# Patient Record
Sex: Female | Born: 1985 | Race: Asian | Hispanic: Yes | Marital: Married | Smoking: Never smoker
Health system: Southern US, Community
[De-identification: ages and names within clinical notes are randomized; demographics above are authoritative.]

## PROBLEM LIST (undated history)

## (undated) DIAGNOSIS — O24419 Gestational diabetes mellitus in pregnancy, unspecified control: Secondary | ICD-10-CM

## (undated) HISTORY — PX: NO PAST SURGERIES: SHX2092

---

## 2003-07-19 ENCOUNTER — Inpatient Hospital Stay (HOSPITAL_COMMUNITY): Admission: AD | Admit: 2003-07-19 | Discharge: 2003-07-19 | Payer: Self-pay | Admitting: Obstetrics and Gynecology

## 2003-07-26 ENCOUNTER — Inpatient Hospital Stay (HOSPITAL_COMMUNITY): Admission: AD | Admit: 2003-07-26 | Discharge: 2003-07-29 | Payer: Self-pay | Admitting: Obstetrics & Gynecology

## 2007-03-07 ENCOUNTER — Encounter: Admission: RE | Admit: 2007-03-07 | Discharge: 2007-03-07 | Payer: Self-pay | Admitting: Obstetrics and Gynecology

## 2007-04-23 ENCOUNTER — Inpatient Hospital Stay (HOSPITAL_COMMUNITY): Admission: RE | Admit: 2007-04-23 | Discharge: 2007-04-25 | Payer: Self-pay | Admitting: Obstetrics and Gynecology

## 2010-06-08 ENCOUNTER — Emergency Department (HOSPITAL_COMMUNITY)
Admission: EM | Admit: 2010-06-08 | Discharge: 2010-06-08 | Payer: Self-pay | Source: Home / Self Care | Admitting: Family Medicine

## 2010-11-07 NOTE — Discharge Summary (Signed)
Paula Holland, Paula Holland       ACCOUNT NO.:  [id number]   MEDICAL RECORD NO.:  [id number]          PATIENT TYPE:  INP   LOCATION:  9119                          FACILITY:  [facility]   PHYSICIAN:  Gerrit Friends. Aldona Bar, M.D.   DATE OF BIRTH:  08-02-1985   DATE OF ADMISSION:  04/23/2007  DATE OF DISCHARGE:  04/25/2007                               DISCHARGE SUMMARY   DISCHARGE DIAGNOSES:  1. Term pregnancy delivered 6-pound 11-ounce female infant, Apgars 9 and      9.  2. Blood type O positive.   PROCEDURE:  Normal spontaneous delivery.   SUMMARY:  This 25 year old gravida 2, now para 2 was admitted at 53 to  [redacted] weeks gestation for induction, after a pregnancy was complicated by  gestational diabetes, well-controlled with diet.  She also had a  positive group B strep antenatally.   She was admitted for induction, placed on intravenous penicillin per  protocol, because of her group B strep culture being positive and  subsequently underwent amniotomy with production of clear fluid,  progressed and had a normal spontaneous delivery over intact perineum of  a viable female infant, weighing 6 pounds, 11 ounces with Apgar of 9 and  9.  Her postpartum course was uncomplicated.  Discharge hemoglobin 13.5  with a white count of 14,100.  Her platelet count was 167,000 at time of  discharge.   On the morning of October 31, she was ambulating well, tolerating a  regular diet well, requiring minimal pain medication, was breast-feeding  without difficulty and was discharged to home.  She was given a  discharge brochure.  Followup in the office will be arranged 4 weeks  hence.  She will use over-the-counter analgesics for discomfort as  needed.   CONDITION ON DISCHARGE:  Improved.      Gerrit Friends. Aldona Bar, M.D.  Electronically Signed     RMW/MEDQ  D:  04/25/2007  T:  04/25/2007  Job:  [id number]

## 2010-11-15 ENCOUNTER — Emergency Department (HOSPITAL_COMMUNITY)
Admission: EM | Admit: 2010-11-15 | Discharge: 2010-11-15 | Disposition: A | Discharge status: Home / Self Care | Payer: Self-pay | Attending: Emergency Medicine | Admitting: Emergency Medicine

## 2010-11-15 DIAGNOSIS — L298 Other pruritus: Secondary | ICD-10-CM | POA: Insufficient documentation

## 2010-11-15 DIAGNOSIS — L509 Urticaria, unspecified: Secondary | ICD-10-CM | POA: Insufficient documentation

## 2010-11-15 DIAGNOSIS — L2989 Other pruritus: Secondary | ICD-10-CM | POA: Insufficient documentation

## 2011-04-04 LAB — CBC
HCT: 39.1
Hemoglobin: 14.9
MCHC: 34.6
MCHC: 35.9
MCV: 86.7
MCV: 88.7
Platelets: 135 — ABNORMAL LOW
RBC: 4.78
RDW: 15.9 — ABNORMAL HIGH

## 2015-02-25 LAB — OB RESULTS CONSOLE RUBELLA ANTIBODY, IGM: Rubella: IMMUNE

## 2015-02-25 LAB — OB RESULTS CONSOLE GC/CHLAMYDIA
Chlamydia: NEGATIVE
Gonorrhea: NEGATIVE

## 2015-02-25 LAB — OB RESULTS CONSOLE HEPATITIS B SURFACE ANTIGEN: HEP B S AG: NEGATIVE

## 2015-02-25 LAB — OB RESULTS CONSOLE HIV ANTIBODY (ROUTINE TESTING): HIV: NONREACTIVE

## 2015-02-25 LAB — OB RESULTS CONSOLE ANTIBODY SCREEN: Antibody Screen: NEGATIVE

## 2015-02-25 LAB — OB RESULTS CONSOLE ABO/RH: RH TYPE: POSITIVE

## 2015-02-25 LAB — OB RESULTS CONSOLE RPR: RPR: NONREACTIVE

## 2015-06-23 ENCOUNTER — Ambulatory Visit (HOSPITAL_COMMUNITY)
Admission: RE | Admit: 2015-06-23 | Discharge: 2015-06-23 | Disposition: A | Discharge status: Home / Self Care | Payer: Self-pay | Source: Ambulatory Visit | Attending: Obstetrics | Admitting: Obstetrics

## 2015-06-23 ENCOUNTER — Encounter: Payer: Self-pay | Attending: Obstetrics & Gynecology | Admitting: *Deleted

## 2015-06-23 DIAGNOSIS — Z713 Dietary counseling and surveillance: Secondary | ICD-10-CM | POA: Insufficient documentation

## 2015-06-23 DIAGNOSIS — O24419 Gestational diabetes mellitus in pregnancy, unspecified control: Secondary | ICD-10-CM | POA: Insufficient documentation

## 2015-06-23 NOTE — Progress Notes (Signed)
  Patient was seen on 06/23/15 for Gestational Diabetes self-management . [place] [phone] ([place]) The following learning objectives were met by the patient :   States the definition of Gestational Diabetes  States why dietary management is important in controlling blood glucose  Describes the effects of carbohydrates on blood glucose levels  Demonstrates ability to create a balanced meal plan  Demonstrates carbohydrate counting   States when to check blood glucose levels  Demonstrates proper blood glucose monitoring techniques  States the effect of stress and exercise on blood glucose levels  States the importance of limiting caffeine and abstaining from alcohol and smoking  Plan:  Aim for 2 Carb Choices per meal (30 grams) +/- 1 either way for breakfast Aim for 3 Carb Choices per meal (45 grams) +/- 1 either way from lunch and dinner Aim for 1-2 Carbs per snack Begin reading food labels for Total Carbohydrate and sugar grams of foods Consider  increasing your activity level by walking daily as tolerated Begin checking BG before breakfast and 2 hours after first bit of breakfast, lunch and dinner after  as directed by MD  Take medication  as directed by MD  Blood glucose monitor:directed patient to [place] for [employer] brand testing supplies Blood glucose reading: '149mg'$ /dl 1hpp pasta  Patient instructed to monitor glucose levels: FBS: 60 - <90 2 hour: <120  Patient received the following handouts:  Nutrition Diabetes and Pregnancy  Carbohydrate Counting List  Meal Planning worksheet  Patient will be seen for follow-up as needed.

## 2015-06-26 NOTE — L&D Delivery Note (Signed)
Delivery Note At 2:47 PM a viable female was delivered via Vaginal, Spontaneous Delivery (Presentation: ;  ).  APGAR: , ; weight  .   Placenta status: , .  Cord: 3 vessels with the following complications: None.  Cord pH: not done  Anesthesia: Epidural  Episiotomy:   Lacerations:   Suture Repair: 2.0 vicryl Est. Blood Loss (mL):    Mom to postpartum.  Baby to Couplet care / Skin to Skin.  MARSHALL,BERNARD A 08/31/2015, 2:55 PM

## 2015-06-28 ENCOUNTER — Inpatient Hospital Stay (HOSPITAL_COMMUNITY): Admission: AD | Admit: 2015-06-28 | Payer: Self-pay | Source: Ambulatory Visit | Admitting: Obstetrics

## 2015-07-12 ENCOUNTER — Encounter (HOSPITAL_COMMUNITY): Payer: Self-pay | Admitting: Obstetrics

## 2015-07-12 ENCOUNTER — Other Ambulatory Visit (HOSPITAL_COMMUNITY): Payer: Self-pay | Admitting: Obstetrics

## 2015-07-12 DIAGNOSIS — O24913 Unspecified diabetes mellitus in pregnancy, third trimester: Secondary | ICD-10-CM

## 2015-07-12 DIAGNOSIS — Z3A32 32 weeks gestation of pregnancy: Secondary | ICD-10-CM

## 2015-07-12 DIAGNOSIS — Z3689 Encounter for other specified antenatal screening: Secondary | ICD-10-CM

## 2015-07-19 ENCOUNTER — Ambulatory Visit (HOSPITAL_COMMUNITY)
Admission: RE | Admit: 2015-07-19 | Discharge: 2015-07-19 | Disposition: A | Discharge status: Home / Self Care | Payer: Self-pay | Source: Ambulatory Visit | Attending: Obstetrics | Admitting: Obstetrics

## 2015-07-19 ENCOUNTER — Encounter (HOSPITAL_COMMUNITY): Payer: Self-pay

## 2015-07-19 ENCOUNTER — Other Ambulatory Visit (HOSPITAL_COMMUNITY): Payer: Self-pay | Admitting: Obstetrics

## 2015-07-19 VITALS — BP 119/78 | HR 67 | Wt 187.2 lb

## 2015-07-19 DIAGNOSIS — Z3A32 32 weeks gestation of pregnancy: Secondary | ICD-10-CM | POA: Insufficient documentation

## 2015-07-19 DIAGNOSIS — Z3689 Encounter for other specified antenatal screening: Secondary | ICD-10-CM

## 2015-07-19 DIAGNOSIS — O24419 Gestational diabetes mellitus in pregnancy, unspecified control: Secondary | ICD-10-CM

## 2015-07-19 DIAGNOSIS — O24410 Gestational diabetes mellitus in pregnancy, diet controlled: Secondary | ICD-10-CM | POA: Insufficient documentation

## 2015-07-19 DIAGNOSIS — O24913 Unspecified diabetes mellitus in pregnancy, third trimester: Secondary | ICD-10-CM

## 2015-07-19 DIAGNOSIS — Z36 Encounter for antenatal screening of mother: Secondary | ICD-10-CM | POA: Insufficient documentation

## 2015-07-19 HISTORY — DX: Gestational diabetes mellitus in pregnancy, unspecified control: O24.419

## 2015-07-19 NOTE — Progress Notes (Signed)
MFM Consultation, staff note:  By way of consultation, I met with Ms. Paula Holland and explained the rationale behind testing for and treating gestational diabetes. I outlined the risks of poorly controlled GDM, including abnormal fetal growth, difficult labor and vaginal delivery, increased risk of cesarean section, and the neonatal metabolic complications including hypoglycemia and the potential for delayed pulmonary maturity.  We also discussed the management of gestational diabetes, with an emphasis on diet and activity/exercise as means of controlling blood sugars.  I detailed the usual schedule of glucose monitoring, with fingerstick testing in the fasting state, and two hours after each meal.  I gave her the usual target blood glucose values of <95 mg/dl fasting and <120 mg/dl at two hours postprandial.  Finally, I went over a protocol for fetal testing which includes serial ultrasounds for fetal growth and twice weekly non-stress tests after 32 weeks for patients with medication requirements, macrosomia, or polyhydramnios.   She should get continued formal dietary counseling.  We will continue to keep touch with her on a regular basis to assess her glycemic control. Since diet alone has failed to control blood sugars adequately, I initiated treatment with an oral hypoglycemic:  Glyburide 1.50m one tablet orally qHS (given the predominance or majority of elevated fastings with some elevated postprandials).  Before leaving, she scheduled a follow-up ultrasound in [redacted] week along with glycemic review.  Recommendation Summary: 1. Glycemic targets reinforced.  2. begin and continue antenatal testing.  Fetal kick counts.  Weekly BPP/AFI. 3. serial growth 4. glyburide 1.262mpo qhs initiated (low dose given patient naive to oral hypoglycemic agents).  will reevaluate in 1 week and adjust accordingly, expecting need to double the dosage. 5. Delivery around 39 weeks  Time Spent: I spent in  excess of 30 minutes in consultation with this patient to review records, evaluate her case, and provide her with an adequate discussion and education.  More than 50% of this time was spent in direct face-to-face counseling.  It was a pleasure seeing your patient in the office today.  Thank you for consultation. Please do not hesitate to contact our service for any further questions.  Thank you,  JeDelman CheadleeHarl FavorJeDelman CheadleMD, MS, FACOG Assistant Professor Section of [place]

## 2015-07-19 NOTE — Consult Note (Signed)
MFM Consultation, staff note:  By way of consultation, I met with Ms. Paula Holland and explained the rationale behind testing for and treating gestational diabetes. I outlined the risks of poorly controlled GDM, including abnormal fetal growth, difficult labor and vaginal delivery, increased risk of cesarean section, and the neonatal metabolic complications including hypoglycemia and the potential for delayed pulmonary maturity.  We also discussed the management of gestational diabetes, with an emphasis on diet and activity/exercise as means of controlling blood sugars.  I detailed the usual schedule of glucose monitoring, with fingerstick testing in the fasting state, and two hours after each meal.  I gave her the usual target blood glucose values of <95 mg/dl fasting and <120 mg/dl at two hours postprandial.  Finally, I went over a protocol for fetal testing which includes serial ultrasounds for fetal growth and twice weekly non-stress tests after 32 weeks for patients with medication requirements, macrosomia, or polyhydramnios.   She should get continued formal dietary counseling.  We will continue to keep touch with her on a regular basis to assess her glycemic control. Since diet alone has failed to control blood sugars adequately, I initiated treatment with an oral hypoglycemic:  Glyburide 1.38m one tablet orally qHS (given the predominance or majority of elevated fastings with some elevated postprandials).  Before leaving, she scheduled a follow-up ultrasound in [redacted] week along with glycemic review.  Recommendation Summary: 1. Glycemic targets reinforced.  2. begin and continue antenatal testing.  Fetal kick counts.  Weekly BPP/AFI. 3. serial growth 4. glyburide 1.281mpo qhs initiated (low dose given patient naive to oral hypoglycemic agents).  will reevaluate in 1 week and adjust accordingly, expecting need to double the dosage. 5. Delivery around 39 weeks  Time Spent: I spent in  excess of 30 minutes in consultation with this patient to review records, evaluate her case, and provide her with an adequate discussion and education.  More than 50% of this time was spent in direct face-to-face counseling.  It was a pleasure seeing your patient in the office today.  Thank you for consultation. Please do not hesitate to contact our service for any further questions.  Thank you,  JeDelman CheadleeHarl FavorJeDelman CheadleMD, MS, FACOG Assistant Professor Section of [place]

## 2015-07-20 ENCOUNTER — Telehealth (HOSPITAL_COMMUNITY): Payer: Self-pay | Admitting: *Deleted

## 2015-07-20 NOTE — Telephone Encounter (Signed)
Prescription for Glyburide 1.25mg  po QHS called into [facility] pharmacy on [address] per patient request.  Eda Royal, interpreter, called to verify pharmacy and make patient aware of prescription being called in.

## 2015-07-26 ENCOUNTER — Other Ambulatory Visit (HOSPITAL_COMMUNITY): Payer: Self-pay

## 2015-07-27 ENCOUNTER — Ambulatory Visit (HOSPITAL_COMMUNITY): Payer: Self-pay

## 2015-07-27 ENCOUNTER — Ambulatory Visit (HOSPITAL_COMMUNITY)
Admission: RE | Admit: 2015-07-27 | Discharge: 2015-07-27 | Disposition: A | Discharge status: Home / Self Care | Payer: Self-pay | Source: Ambulatory Visit | Attending: Obstetrics | Admitting: Obstetrics

## 2015-07-27 ENCOUNTER — Other Ambulatory Visit (HOSPITAL_COMMUNITY): Payer: Self-pay | Admitting: Obstetrics and Gynecology

## 2015-07-27 DIAGNOSIS — O24419 Gestational diabetes mellitus in pregnancy, unspecified control: Secondary | ICD-10-CM

## 2015-07-27 DIAGNOSIS — O24415 Gestational diabetes mellitus in pregnancy, controlled by oral hypoglycemic drugs: Secondary | ICD-10-CM

## 2015-07-27 DIAGNOSIS — Z3A34 34 weeks gestation of pregnancy: Secondary | ICD-10-CM | POA: Insufficient documentation

## 2015-07-29 ENCOUNTER — Ambulatory Visit (HOSPITAL_COMMUNITY): Payer: Self-pay

## 2015-08-03 ENCOUNTER — Encounter (HOSPITAL_COMMUNITY): Payer: Self-pay

## 2015-08-03 ENCOUNTER — Ambulatory Visit (HOSPITAL_COMMUNITY)
Admission: RE | Admit: 2015-08-03 | Discharge: 2015-08-03 | Disposition: A | Discharge status: Home / Self Care | Payer: Self-pay | Source: Ambulatory Visit | Attending: Obstetrics and Gynecology | Admitting: Obstetrics and Gynecology

## 2015-08-03 DIAGNOSIS — Z3A35 35 weeks gestation of pregnancy: Secondary | ICD-10-CM | POA: Insufficient documentation

## 2015-08-03 DIAGNOSIS — O24419 Gestational diabetes mellitus in pregnancy, unspecified control: Secondary | ICD-10-CM

## 2015-08-03 DIAGNOSIS — O24415 Gestational diabetes mellitus in pregnancy, controlled by oral hypoglycemic drugs: Secondary | ICD-10-CM | POA: Insufficient documentation

## 2015-08-05 ENCOUNTER — Other Ambulatory Visit (HOSPITAL_COMMUNITY): Payer: Self-pay

## 2015-08-09 LAB — OB RESULTS CONSOLE GBS: STREP GROUP B AG: NEGATIVE

## 2015-08-10 ENCOUNTER — Other Ambulatory Visit: Payer: Self-pay | Admitting: Obstetrics

## 2015-08-10 ENCOUNTER — Ambulatory Visit (HOSPITAL_COMMUNITY)
Admission: RE | Admit: 2015-08-10 | Discharge: 2015-08-10 | Disposition: A | Discharge status: Home / Self Care | Payer: Self-pay | Source: Ambulatory Visit | Attending: Obstetrics and Gynecology | Admitting: Obstetrics and Gynecology

## 2015-08-10 ENCOUNTER — Other Ambulatory Visit (HOSPITAL_COMMUNITY): Payer: Self-pay | Admitting: Obstetrics and Gynecology

## 2015-08-10 ENCOUNTER — Other Ambulatory Visit (HOSPITAL_COMMUNITY): Payer: Self-pay

## 2015-08-10 ENCOUNTER — Encounter (HOSPITAL_COMMUNITY): Payer: Self-pay

## 2015-08-10 DIAGNOSIS — Z3A36 36 weeks gestation of pregnancy: Secondary | ICD-10-CM

## 2015-08-10 DIAGNOSIS — O24419 Gestational diabetes mellitus in pregnancy, unspecified control: Secondary | ICD-10-CM

## 2015-08-10 DIAGNOSIS — O24415 Gestational diabetes mellitus in pregnancy, controlled by oral hypoglycemic drugs: Secondary | ICD-10-CM

## 2015-08-10 MED ORDER — TETANUS-DIPHTH-ACELL PERTUSSIS 5-2.5-18.5 LF-MCG/0.5 IM SUSP
0.5000 mL | Freq: Once | INTRAMUSCULAR | Status: AC
  Administered 2015-08-10: 0.5 mL via INTRAMUSCULAR
  Filled 2015-08-10: qty 0.5

## 2015-08-18 ENCOUNTER — Encounter (HOSPITAL_COMMUNITY): Payer: Self-pay

## 2015-08-18 ENCOUNTER — Other Ambulatory Visit (HOSPITAL_COMMUNITY): Payer: Self-pay | Admitting: Obstetrics and Gynecology

## 2015-08-18 ENCOUNTER — Ambulatory Visit (HOSPITAL_COMMUNITY)
Admission: RE | Admit: 2015-08-18 | Discharge: 2015-08-18 | Disposition: A | Discharge status: Home / Self Care | Payer: Self-pay | Source: Ambulatory Visit | Attending: Obstetrics | Admitting: Obstetrics

## 2015-08-18 DIAGNOSIS — O24419 Gestational diabetes mellitus in pregnancy, unspecified control: Secondary | ICD-10-CM

## 2015-08-18 DIAGNOSIS — Z3A37 37 weeks gestation of pregnancy: Secondary | ICD-10-CM | POA: Insufficient documentation

## 2015-08-18 DIAGNOSIS — O24415 Gestational diabetes mellitus in pregnancy, controlled by oral hypoglycemic drugs: Secondary | ICD-10-CM

## 2015-08-18 NOTE — ED Notes (Signed)
Blood sugar log copied, scanned to chart and rev'd by Dr. Otho Perl.  Refill for Glyburide 1.25mg  called to [facility] on [address], per Dr. Otho Perl.

## 2015-08-23 ENCOUNTER — Other Ambulatory Visit: Payer: Self-pay | Admitting: Obstetrics

## 2015-08-23 ENCOUNTER — Telehealth (HOSPITAL_COMMUNITY): Payer: Self-pay | Admitting: *Deleted

## 2015-08-23 NOTE — Telephone Encounter (Signed)
Preadmission screen  

## 2015-08-24 ENCOUNTER — Other Ambulatory Visit (HOSPITAL_COMMUNITY): Payer: Self-pay

## 2015-08-24 ENCOUNTER — Encounter (HOSPITAL_COMMUNITY): Payer: Self-pay | Admitting: *Deleted

## 2015-08-24 ENCOUNTER — Ambulatory Visit (HOSPITAL_COMMUNITY): Payer: Self-pay

## 2015-08-25 ENCOUNTER — Encounter (HOSPITAL_COMMUNITY): Payer: Self-pay | Admitting: *Deleted

## 2015-08-25 ENCOUNTER — Other Ambulatory Visit (HOSPITAL_COMMUNITY): Payer: Self-pay | Admitting: Maternal and Fetal Medicine

## 2015-08-25 ENCOUNTER — Ambulatory Visit (HOSPITAL_COMMUNITY)
Admission: RE | Admit: 2015-08-25 | Discharge: 2015-08-25 | Disposition: A | Discharge status: Home / Self Care | Payer: Self-pay | Source: Ambulatory Visit | Attending: Obstetrics | Admitting: Obstetrics

## 2015-08-25 DIAGNOSIS — Z3A38 38 weeks gestation of pregnancy: Secondary | ICD-10-CM

## 2015-08-25 DIAGNOSIS — O24415 Gestational diabetes mellitus in pregnancy, controlled by oral hypoglycemic drugs: Secondary | ICD-10-CM | POA: Insufficient documentation

## 2015-08-25 NOTE — Telephone Encounter (Signed)
Interpreter number [phone]

## 2015-08-31 ENCOUNTER — Inpatient Hospital Stay (HOSPITAL_COMMUNITY): Payer: Medicaid Other | Admitting: Anesthesiology

## 2015-08-31 ENCOUNTER — Inpatient Hospital Stay (HOSPITAL_COMMUNITY)
Admission: RE | Admit: 2015-08-31 | Discharge: 2015-09-02 | DRG: 775 | Disposition: A | Discharge status: Home / Self Care | Payer: Medicaid Other | Source: Ambulatory Visit | Attending: Obstetrics | Admitting: Obstetrics

## 2015-08-31 ENCOUNTER — Encounter (HOSPITAL_COMMUNITY): Payer: Self-pay

## 2015-08-31 DIAGNOSIS — Z3490 Encounter for supervision of normal pregnancy, unspecified, unspecified trimester: Secondary | ICD-10-CM

## 2015-08-31 DIAGNOSIS — O24425 Gestational diabetes mellitus in childbirth, controlled by oral hypoglycemic drugs: Principal | ICD-10-CM | POA: Diagnosis present

## 2015-08-31 DIAGNOSIS — Z3A39 39 weeks gestation of pregnancy: Secondary | ICD-10-CM

## 2015-08-31 LAB — CBC
HCT: 39.2 % (ref 36.0–46.0)
Hemoglobin: 13.4 g/dL (ref 12.0–15.0)
MCH: 27.7 pg (ref 26.0–34.0)
MCHC: 34.2 g/dL (ref 30.0–36.0)
MCV: 81 fL (ref 78.0–100.0)
PLATELETS: 152 10*3/uL (ref 150–400)
RBC: 4.84 MIL/uL (ref 3.87–5.11)
RDW: 14.9 % (ref 11.5–15.5)
WBC: 8 10*3/uL (ref 4.0–10.5)

## 2015-08-31 LAB — RPR: RPR: NONREACTIVE

## 2015-08-31 LAB — TYPE AND SCREEN
ABO/RH(D): O POS
ANTIBODY SCREEN: NEGATIVE

## 2015-08-31 LAB — GLUCOSE, CAPILLARY: GLUCOSE-CAPILLARY: 98 mg/dL (ref 65–99)

## 2015-08-31 LAB — ABO/RH: ABO/RH(D): O POS

## 2015-08-31 MED ORDER — FENTANYL 2.5 MCG/ML BUPIVACAINE 1/10 % EPIDURAL INFUSION (WH - ANES)
14.0000 mL/h | INTRAMUSCULAR | Status: DC | PRN
  Administered 2015-08-31: 14 mL/h via EPIDURAL
  Filled 2015-08-31: qty 125

## 2015-08-31 MED ORDER — TERBUTALINE SULFATE 1 MG/ML IJ SOLN
0.2500 mg | Freq: Once | INTRAMUSCULAR | Status: DC | PRN
  Filled 2015-08-31: qty 1

## 2015-08-31 MED ORDER — BUTORPHANOL TARTRATE 1 MG/ML IJ SOLN: 1.0000 mg | INTRAMUSCULAR | Status: DC | PRN

## 2015-08-31 MED ORDER — TETANUS-DIPHTH-ACELL PERTUSSIS 5-2.5-18.5 LF-MCG/0.5 IM SUSP: 0.5000 mL | Freq: Once | INTRAMUSCULAR | Status: DC

## 2015-08-31 MED ORDER — EPHEDRINE 5 MG/ML INJ
10.0000 mg | INTRAVENOUS | Status: DC | PRN
  Filled 2015-08-31: qty 2

## 2015-08-31 MED ORDER — OXYTOCIN 10 UNIT/ML IJ SOLN: 2.5000 [IU]/h | INTRAVENOUS | Status: DC

## 2015-08-31 MED ORDER — PHENYLEPHRINE 40 MCG/ML (10ML) SYRINGE FOR IV PUSH (FOR BLOOD PRESSURE SUPPORT)
80.0000 ug | PREFILLED_SYRINGE | INTRAVENOUS | Status: DC | PRN
  Filled 2015-08-31: qty 20
  Filled 2015-08-31: qty 2

## 2015-08-31 MED ORDER — ACETAMINOPHEN 325 MG PO TABS: 650.0000 mg | ORAL_TABLET | ORAL | Status: DC | PRN

## 2015-08-31 MED ORDER — OXYTOCIN 10 UNIT/ML IJ SOLN
1.0000 m[IU]/min | INTRAVENOUS | Status: DC
  Administered 2015-08-31: 2 m[IU]/min via INTRAVENOUS
  Filled 2015-08-31: qty 10

## 2015-08-31 MED ORDER — IBUPROFEN 600 MG PO TABS
600.0000 mg | ORAL_TABLET | Freq: Four times a day (QID) | ORAL | Status: DC
  Administered 2015-08-31 – 2015-09-02 (×8): 600 mg via ORAL
  Filled 2015-08-31 (×8): qty 1

## 2015-08-31 MED ORDER — SIMETHICONE 80 MG PO CHEW: 80.0000 mg | CHEWABLE_TABLET | ORAL | Status: DC | PRN

## 2015-08-31 MED ORDER — OXYCODONE-ACETAMINOPHEN 5-325 MG PO TABS
1.0000 | ORAL_TABLET | ORAL | Status: DC | PRN
  Administered 2015-09-01 (×2): 1 via ORAL
  Filled 2015-08-31 (×2): qty 1

## 2015-08-31 MED ORDER — LACTATED RINGERS IV SOLN
500.0000 mL | INTRAVENOUS | Status: DC | PRN
  Administered 2015-08-31: 500 mL via INTRAVENOUS

## 2015-08-31 MED ORDER — ONDANSETRON HCL 4 MG/2ML IJ SOLN: 4.0000 mg | Freq: Four times a day (QID) | INTRAMUSCULAR | Status: DC | PRN

## 2015-08-31 MED ORDER — OXYTOCIN BOLUS FROM INFUSION: 500.0000 mL | INTRAVENOUS | Status: DC

## 2015-08-31 MED ORDER — CITRIC ACID-SODIUM CITRATE 334-500 MG/5ML PO SOLN: 30.0000 mL | ORAL | Status: DC | PRN

## 2015-08-31 MED ORDER — ONDANSETRON HCL 4 MG PO TABS: 4.0000 mg | ORAL_TABLET | ORAL | Status: DC | PRN

## 2015-08-31 MED ORDER — BENZOCAINE-MENTHOL 20-0.5 % EX AERO: 1.0000 "application " | INHALATION_SPRAY | CUTANEOUS | Status: DC | PRN

## 2015-08-31 MED ORDER — WITCH HAZEL-GLYCERIN EX PADS: 1.0000 "application " | MEDICATED_PAD | CUTANEOUS | Status: DC | PRN

## 2015-08-31 MED ORDER — LANOLIN HYDROUS EX OINT: TOPICAL_OINTMENT | CUTANEOUS | Status: DC | PRN

## 2015-08-31 MED ORDER — DIPHENHYDRAMINE HCL 25 MG PO CAPS: 25.0000 mg | ORAL_CAPSULE | Freq: Four times a day (QID) | ORAL | Status: DC | PRN

## 2015-08-31 MED ORDER — LACTATED RINGERS IV SOLN
INTRAVENOUS | Status: DC
  Administered 2015-08-31: 09:00:00 via INTRAVENOUS

## 2015-08-31 MED ORDER — LIDOCAINE HCL (PF) 1 % IJ SOLN
30.0000 mL | INTRAMUSCULAR | Status: DC | PRN
  Filled 2015-08-31: qty 30

## 2015-08-31 MED ORDER — DIPHENHYDRAMINE HCL 50 MG/ML IJ SOLN: 12.5000 mg | INTRAMUSCULAR | Status: DC | PRN

## 2015-08-31 MED ORDER — DIBUCAINE 1 % RE OINT: 1.0000 "application " | TOPICAL_OINTMENT | RECTAL | Status: DC | PRN

## 2015-08-31 MED ORDER — PRENATAL MULTIVITAMIN CH
1.0000 | ORAL_TABLET | Freq: Every day | ORAL | Status: DC
  Administered 2015-09-01 – 2015-09-02 (×2): 1 via ORAL
  Filled 2015-08-31 (×2): qty 1

## 2015-08-31 MED ORDER — ONDANSETRON HCL 4 MG/2ML IJ SOLN: 4.0000 mg | INTRAMUSCULAR | Status: DC | PRN

## 2015-08-31 MED ORDER — FERROUS SULFATE 325 (65 FE) MG PO TABS
325.0000 mg | ORAL_TABLET | Freq: Two times a day (BID) | ORAL | Status: DC
  Administered 2015-08-31 – 2015-09-02 (×4): 325 mg via ORAL
  Filled 2015-08-31 (×4): qty 1

## 2015-08-31 MED ORDER — OXYCODONE-ACETAMINOPHEN 5-325 MG PO TABS: 2.0000 | ORAL_TABLET | ORAL | Status: DC | PRN

## 2015-08-31 MED ORDER — LACTATED RINGERS IV SOLN: 500.0000 mL | Freq: Once | INTRAVENOUS | Status: DC

## 2015-08-31 MED ORDER — PHENYLEPHRINE 40 MCG/ML (10ML) SYRINGE FOR IV PUSH (FOR BLOOD PRESSURE SUPPORT)
80.0000 ug | PREFILLED_SYRINGE | INTRAVENOUS | Status: DC | PRN
  Filled 2015-08-31: qty 2

## 2015-08-31 MED ORDER — SENNOSIDES-DOCUSATE SODIUM 8.6-50 MG PO TABS
2.0000 | ORAL_TABLET | ORAL | Status: DC
  Administered 2015-08-31: 2 via ORAL
  Filled 2015-08-31 (×2): qty 2

## 2015-08-31 MED ORDER — FLEET ENEMA 7-19 GM/118ML RE ENEM: 1.0000 | ENEMA | RECTAL | Status: DC | PRN

## 2015-08-31 MED ORDER — LIDOCAINE HCL (PF) 1 % IJ SOLN
INTRAMUSCULAR | Status: DC | PRN
  Administered 2015-08-31 (×2): 4 mL via EPIDURAL

## 2015-08-31 MED ORDER — ZOLPIDEM TARTRATE 5 MG PO TABS
5.0000 mg | ORAL_TABLET | Freq: Every evening | ORAL | Status: DC | PRN
Start: 2015-08-31 — End: 2015-09-02

## 2015-08-31 NOTE — Consults (Signed)
  Anesthesia Pain Consult Note  Patient: Brynda GreathouseMaria I Martinez-Barron, 30 y.o., female  Consult Requested by: Kathreen CosierBernard A Marshall, MD  Reason for Consult: Anesthesia epidural rounds  Pt recently received epidural and states she no longer feels contractions. States pain level a 1 with her acceptable pain level a 6/10.         Edison PaceWILKERSON,Cassity Christian 08/31/2015

## 2015-08-31 NOTE — Anesthesia Procedure Notes (Signed)
Epidural Patient location during procedure: OB Start time: 08/31/2015 1:59 PM  Staffing Anesthesiologist: Mal AmabileFOSTER, Berline Semrad Performed by: anesthesiologist   Preanesthetic Checklist Completed: patient identified, site marked, surgical consent, pre-op evaluation, timeout performed, IV checked, risks and benefits discussed and monitors and equipment checked  Epidural Patient position: sitting Prep: site prepped and draped and DuraPrep Patient monitoring: continuous pulse ox and blood pressure Approach: midline Location: L3-L4 Injection technique: LOR air  Needle:  Needle type: Tuohy  Needle gauge: 17 G Needle length: 9 cm and 9 Needle insertion depth: 5 cm cm Catheter type: closed end flexible Catheter size: 19 Gauge Catheter at skin depth: 10 cm Test dose: negative and Other  Assessment Events: blood not aspirated, injection not painful, no injection resistance, negative IV test and no paresthesia  Additional Notes Patient identified. Risks and benefits discussed including failed block, incomplete  Pain control, post dural puncture headache, nerve damage, paralysis, blood pressure Changes, nausea, vomiting, reactions to medications-both toxic and allergic and post Partum back pain. All questions were answered. Patient expressed understanding and wished to proceed. Sterile technique was used throughout procedure. Epidural site was Dressed with sterile barrier dressing. No paresthesias, signs of intravascular injection Or signs of intrathecal spread were encountered.  Patient was more comfortable after the epidural was dosed. Please see RN's note for documentation of vital signs and FHR which are stable.

## 2015-08-31 NOTE — Anesthesia Preprocedure Evaluation (Signed)
Anesthesia Evaluation  Patient identified by MRN, date of birth, ID band Patient awake    Reviewed: Allergy & Precautions, NPO status , Patient's Chart, lab work & pertinent test results  Airway Mallampati: III  TM Distance: >3 FB Neck ROM: Full    Dental no notable dental hx. (+) Teeth Intact   Pulmonary neg pulmonary ROS,    Pulmonary exam normal breath sounds clear to auscultation       Cardiovascular negative cardio ROS Normal cardiovascular exam Rhythm:Regular Rate:Normal     Neuro/Psych negative neurological ROS  negative psych ROS   GI/Hepatic negative GI ROS, Neg liver ROS,   Endo/Other  diabetes, Well Controlled, Gestational, Oral Hypoglycemic AgentsObesity  Renal/GU negative Renal ROS     Musculoskeletal negative musculoskeletal ROS (+)   Abdominal (+) + obese,   Peds  Hematology negative hematology ROS (+)   Anesthesia Other Findings   Reproductive/Obstetrics (+) Pregnancy                             Anesthesia Physical Anesthesia Plan  ASA: III  Anesthesia Plan: Epidural   Post-op Pain Management:    Induction:   Airway Management Planned: Natural Airway  Additional Equipment:   Intra-op Plan:   Post-operative Plan:   Informed Consent: I have reviewed the patients History and Physical, chart, labs and discussed the procedure including the risks, benefits and alternatives for the proposed anesthesia with the patient or authorized representative who has indicated his/her understanding and acceptance.     Plan Discussed with: Anesthesiologist, CRNA and Surgeon  Anesthesia Plan Comments:         Anesthesia Quick Evaluation

## 2015-08-31 NOTE — H&P (Signed)
This is Dr. Francoise CeoBernard Eyan Hagood dictating the history and physical on  Paula Holland  she is a 30 year old gravida 3 para 2002 EDC 09/06/1737 weeks negative GBS patient's a diabetic on glyburide 1.25 mg at bedtime her blood sugar to days 98 and she was brought in for induction cervix 3-4 cm 80% vertex -2 station amniotomy performed the fluids clear and she is on low-dose Pitocin contracting irregularly Past surgical history negative Past medical history history gestational diabetes Social history negative System review noncontributory Physical exam well-developed female in early labor HEENT negative Heart regular rhythm no murmurs no gallops Breasts negative Lungs clear to P&A Abdomen term Pelvic as described above Extremities negative

## 2015-09-01 ENCOUNTER — Ambulatory Visit (HOSPITAL_COMMUNITY): Payer: Self-pay

## 2015-09-01 LAB — CBC
HCT: 37.8 % (ref 36.0–46.0)
HEMOGLOBIN: 12.7 g/dL (ref 12.0–15.0)
MCH: 27.5 pg (ref 26.0–34.0)
MCHC: 33.6 g/dL (ref 30.0–36.0)
MCV: 82 fL (ref 78.0–100.0)
PLATELETS: 121 10*3/uL — AB (ref 150–400)
RBC: 4.61 MIL/uL (ref 3.87–5.11)
RDW: 14.7 % (ref 11.5–15.5)
WBC: 12.5 10*3/uL — ABNORMAL HIGH (ref 4.0–10.5)

## 2015-09-01 NOTE — Progress Notes (Signed)
Patient ID: Paula GreathouseMaria I Holland, female   DOB: 10-08-1985, 30 y.o.   MRN: [id number] Postpartum day one Blood pressure 107/55 afebrile pulse 57 respiration 18 Fundus firm Lochia moderate Legs negative doing well

## 2015-09-01 NOTE — Lactation Note (Signed)
This note was copied from a baby's chart. Lactation Consultation Note Experienced BF mom, but her 2 other children are 7310 and 918 yrs old. Mom is currently BF baby in cradle position leaning over to baby. Interpreter present, encouraged mom to bring baby to her, not lean to baby, and be comfortable during BF. Mom has wide space between cone shaped breast w/everted nipple. Demonstrated hand expression w/noted colostrum. Mom had baby wrapped in blanket, hat on, rm. Warm, mom sweating, and baby's face red. Discussed over heating baby, and will make baby sleepy if to hot. Encouraged STS and remove excess cover when BF. Mom encouraged to feed baby 8-12 times/24 hours and with feeding cues. Referred to Baby and Me Book in Breastfeeding section Pg. 22-23 for position options and Proper latch demonstration. Educated about newborn behavior, I&O, cluster feeding, supply and demand. [facility]/LC brochure given w/resources, support groups and LC services.   Feeding Feeding Type: Breast Fed Length of feed: 20 min  LATCH Score/Interventions Latch: Grasps breast easily, tongue down, lips flanged, rhythmical sucking. Intervention(s): Adjust position;Assist with latch;Breast massage;Breast compression  Audible Swallowing: A few with stimulation Intervention(s): Hand expression;Alternate breast massage  Type of Nipple: Everted at rest and after stimulation  Comfort (Breast/Nipple): Soft / non-tender     Hold (Positioning): No assistance needed to correctly position infant at breast.  LATCH Score: 9  Lactation Tools Discussed/Used WIC Program: No   Consult Status Consult Status: PRN Date: 09/01/15 Follow-up type: In-patient    Dunia Pringle, Diamond NickelLAURA G 09/01/2015, 1:23 AM

## 2015-09-01 NOTE — Progress Notes (Signed)
[employer] used for assessment this morning for the mother and infant.  [employer] # [phone] Cox, Paula StageMarissa Holland

## 2015-09-01 NOTE — Lactation Note (Addendum)
This note was copied from a baby's chart. Lactation Consultation Note  Patient Name: Girl Lynann BeaverMaria Martinez-Barron ZOXWR'UToday's Date: 09/01/2015 Reason for consult: Follow-up assessment  Baby 26 hours old. Assisted by "Bonnye FavaViria" in-house Spanish Interpreter. Mom nursing baby when this LC first entered room. Mom had baby in cradle position on right breast. Enc mom to support baby's head and keep baby tucked in close to maintain a deep latch. Discussed initial nipple discomfort d/t baby stretching nipple during latching. Discussed cluster feeding, and enc feeding with cues. Discussed normal progression of milk coming to volume. Mom states that she has no additional questions at this time.  Maternal Data    Feeding Feeding Type: Breast Fed Length of feed: 35 min  LATCH Score/Interventions Latch: Grasps breast easily, tongue down, lips flanged, rhythmical sucking. Intervention(s):  (Enc supporting baby's head at breast to maintain a deep latch.)  Audible Swallowing: A few with stimulation Intervention(s): Skin to skin  Type of Nipple: Everted at rest and after stimulation  Comfort (Breast/Nipple): Soft / non-tender     Hold (Positioning): No assistance needed to correctly position infant at breast.  LATCH Score: 9  Lactation Tools Discussed/Used     Consult Status Consult Status: Follow-up Date: 09/02/15 Follow-up type: In-patient    Geralynn OchsWILLIARD, Chapel Silverthorn 09/01/2015, 5:40 PM

## 2015-09-01 NOTE — Anesthesia Postprocedure Evaluation (Signed)
Anesthesia Post Note  Patient: Paula GreathouseMaria I Holland  Procedure(s) Performed: * No procedures listed *  Patient location during evaluation: Mother Baby Anesthesia Type: Epidural Level of consciousness: awake, awake and alert and oriented Pain management: pain level controlled Vital Signs Assessment: post-procedure vital signs reviewed and stable Respiratory status: spontaneous breathing Cardiovascular status: stable Postop Assessment: adequate PO intake and no signs of nausea or vomiting Anesthetic complications: no    Last Vitals:  Filed Vitals:   08/31/15 2130 09/01/15 0530  BP: 104/55 107/55  Pulse: 68 57  Temp: 36.9 C 36.5 C  Resp: 18 18    Last Pain:  Filed Vitals:   09/01/15 0628  PainSc: 0-No pain                 Zebastian Carico Hristova

## 2015-09-02 ENCOUNTER — Other Ambulatory Visit (HOSPITAL_COMMUNITY): Payer: Self-pay

## 2015-09-02 NOTE — Progress Notes (Signed)
Patient ID: Paula GreathouseMaria I Holland, female   DOB: 08/04/1985, 30 y.o.   MRN: [id number] Postpartum day 2 blood pressure 114/70 pulse 74 respiration 16 Fundus firm Lochia moderate Legs negative doing well home today

## 2015-09-02 NOTE — Lactation Note (Signed)
This note was copied from a baby's chart. Lactation Consultation Note  Patient Name: Paula Holland GEXBM'WToday's Date: 09/02/2015 Reason for consult: Follow-up assessment   [facility]  Follow up with experienced BF mom of 41 hour old infant. Infant with 11 BF for 10-40 minutes, 3 attempts, 1 void and 2 stools in last 24 hours. Infant weight 7 lb 0.3 oz with weight loss of 6%. LATCH scores 8-9 by bedside RN's.  Infant asleep in mom's arms, mom reports infant just finished feeding. Mom reports nipple tenderness with latch that improves with feeding, enc her to express colostrum post feed ans allow to air dry on nipples.  Reviewed all BF information in Taking Care of Baby and Me Booklet. Engorgement prevention/treatment reviewed with mom. Manual pump given with instructions for use and cleaning. Reviewed I/O and enc parents to maintain feeding log [facility]  [facility] Infant with Ped visit scheduled for today.  Reviewed LC brochure, mom aware of OP Services, BF Support Groups and LC phone #.  Mom with questions in regards to taking Zyrtec for allergies, Shared information in [place] Medications and Mother's Milk 2014, and enc her to discuss with Pediatrician.   Enc mom to call with questions/concerns prn.   Maternal Data Formula Feeding for Exclusion: No Does the patient have breastfeeding experience prior to this delivery?: Yes  Feeding Feeding Type: Breast Fed Length of feed: 20 min  LATCH Score/Interventions                      Lactation Tools Discussed/Used WIC Program: Yes Pump Review: Setup, frequency, and cleaning;Milk Storage   Consult Status Consult Status: Complete Follow-up type: Call as needed    Ed BlalockSharon S Dalene Robards 09/02/2015, 8:36 AM

## 2015-09-02 NOTE — Discharge Summary (Signed)
Obstetric Discharge Summary Reason for Admission: induction of labor Prenatal Procedures: none Intrapartum Procedures: spontaneous vaginal delivery Postpartum Procedures: none Complications-Operative and Postpartum: none HEMOGLOBIN  Date Value Ref Range Status  09/01/2015 12.7 12.0 - 15.0 g/dL Final   HCT  Date Value Ref Range Status  09/01/2015 37.8 36.0 - 46.0 % Final    Physical Exam:  General: alert Lochia: appropriate Uterine Fundus: firm Incision: healing well DVT Evaluation: No evidence of DVT seen on physical exam.  Discharge Diagnoses: Term Pregnancy-delivered  Discharge Information: Date: 09/02/2015 Activity: pelvic rest Diet: routine Medications: Percocet Condition: improved Instructions: refer to practice specific booklet Discharge to: home Follow-up Information    Follow up with MARSHALL,BERNARD A, MD In 6 weeks.   Specialty:  Obstetrics and Gynecology   Contact information:   [address] [place] [id number] [phone]       Newborn Data: Live born female  Birth Weight: 7 lb 7.4 oz (3385 g) APGAR: 9, 9  Home with mother.  MARSHALL,BERNARD A 09/02/2015, 7:03 AM

## 2015-09-02 NOTE — Discharge Instructions (Signed)
Discharge instructions   You can wash your hair  Shower  Eat what you want  Drink what you want  See me in 6 weeks  Your ankles are going to swell more in the next 2 weeks than when pregnant  No sex for 6 weeks   Paula Holland A, MD 09/02/2015

## 2015-09-07 ENCOUNTER — Ambulatory Visit (HOSPITAL_COMMUNITY): Payer: Self-pay

## 2015-09-21 ENCOUNTER — Ambulatory Visit (HOSPITAL_COMMUNITY)
Admission: RE | Admit: 2015-09-21 | Discharge: 2015-09-21 | Disposition: A | Discharge status: Home / Self Care | Payer: Medicaid Other | Source: Ambulatory Visit | Attending: Obstetrics | Admitting: Obstetrics

## 2015-09-21 NOTE — Lactation Note (Addendum)
Lactation Consult for Paula GarinJuliet Holland (DOB: [birth year])  Mother's reason for visit: She was told at the clinic that she wasn't getting a deep enough latch Consult:  Initial Lactation Consultant:  Remigio Eisenmengerichey, Kaedin Hicklin Hamilton  ________________________________________________________________________ [birth year], BW: 3385g (7# 7.4oz) 09-02-15: 7# 0.3oz (nadir, 5.9%) 09-05-15: 7# 0.5oz 09-07-15: 7# 1.5oz 09-15-15: 7# 6 oz 09-16-15: 7# 9.5oz Today's weight (09-21-15): 8# 0.3oz ________________________________________________________________________  Mother's Name: Paula GreathouseMaria I Holland Type of delivery:  Vag Breastfeeding Experience: P3, she nursed her other children for 8 months ________________________________________________________________________  Breastfeeding History (Post Discharge)  Frequency of breastfeeding: q3h Duration of feeding: 5525min/breast  Pumping  Type of pump:  Manual (about 15 min/side)  Frequency: 3-4 times/week Volume: 4-5 oz  Supplementing For the last 2 weeks, Mom has been giving 2 oz formula (Similac) bid using a [place] bottle.   Infant Intake and Output Assessment  Voids: 7 in 24 hrs.  Color:  Clear yellow Stools: 4-5 in 24 hrs.  Color:  Yellow  ________________________________________________________________________  Maternal Breast Assessment  Breast:  Full Nipple:  Erect  _______________________________________________________________________ Feeding Assessment/Evaluation  Initial feeding assessment:  Infant's oral assessment:  WNL  Attached assessment:  Deep  Lips flanged:  Yes.     Suck assessment:  Nutritive and Displays both  Pre-feed weight: 3638 g   Post-feed weight: 3662  g  Amount transferred: 24 ml L breast for 10 min  Pre-feed weight: 3662 g   Post-feed weight: 3702 g  Amount supplemented: 40 ml R breast, for almost 30 min.   Total amount transferred: 64 ml  Paula is 9 oz over BW at 553 weeks of age (she has  gained 6.5 oz over the last 5 days). At beginning of consult, Mom described hx of "burning" w/latch that persisted throughout the feeding. Mom also reported appearance of pinched nipples when Paula released her latch.  Mom denies nipple trauma or s/sxs of nipple vasospasm. No signs of yeast noted.   Mom was noted not to be using an asymmetric latch w/breastfeeding. Once this was changed, Mom no longer felt discomfort and there was no pinching of nipple noted when Paula released subsequent latches. Specifics of an asymmetric latch shown via [employer] Procter & GambleKellyMom website animation & hand-placement on baby & breast discussed so Mom can replicate at home.   Interpreter, Viviana SimplerAlis Herrera, present during entire consult.  Glenetta HewKim Erick Oxendine, RN, IBCLC

## 2016-01-14 ENCOUNTER — Emergency Department (HOSPITAL_COMMUNITY): Payer: Self-pay

## 2016-01-14 ENCOUNTER — Emergency Department (HOSPITAL_COMMUNITY)
Admission: EM | Admit: 2016-01-14 | Discharge: 2016-01-14 | Disposition: A | Discharge status: Home / Self Care | Payer: Self-pay | Attending: Physician Assistant | Admitting: Physician Assistant

## 2016-01-14 ENCOUNTER — Encounter (HOSPITAL_COMMUNITY): Payer: Self-pay

## 2016-01-14 DIAGNOSIS — N644 Mastodynia: Secondary | ICD-10-CM

## 2016-01-14 DIAGNOSIS — R52 Pain, unspecified: Secondary | ICD-10-CM

## 2016-01-14 MED ORDER — IBUPROFEN 600 MG PO TABS: 600.0000 mg | ORAL_TABLET | Freq: Four times a day (QID) | ORAL | Status: DC | PRN

## 2016-01-14 NOTE — ED Notes (Signed)
Patient transported to X-ray 

## 2016-01-14 NOTE — ED Notes (Signed)
Patient transported to Ultrasound via stretcher by transporter.

## 2016-01-14 NOTE — Discharge Instructions (Signed)
Read the information below.  Use the prescribed medication as directed.  Please discuss all new medications with your pharmacist.  You may return to the Emergency Department at any time for worsening condition or any new symptoms that concern you.  If you develop fever, redness, hardness of the breast return to the ED for further evaluation.  Continue to breast feed as you have.  Use warm moist compresses for pain.    Lea la informacin a continuacin. Use la medicacin prescrita como se indica. Por favor discuta todos los nuevos medicamentos con su farmacutico. Usted puede regresar al Departamento de Emergencias en cualquier momento por empeoramiento de la condicin o cualquier nuevo sntoma que le afecte. Si usted desarrolla fiebre, enrojecimiento, la dureza de la mama volver a la DE para su posterior evaluacin. Contine amamantando como usted. Use compresas tibias hmedas para el dolor.  Sensibilidad en las mamas (Breast Tenderness) La sensibilidad en las mamas es un problema frecuente en las mujeres de todas las edades. y puede causar molestias leves o dolor intenso. Sus causas son variadas. Su mdico determinar la causa probable de la sensibilidad Education administrator examen de las [place], las preguntas [employer] sntomas y la indicacin de algunos estudios. Por lo general, la sensibilidad en las mamas no significa que tenga cncer de mama. INSTRUCCIONES PARA EL CUIDADO EN EL HOGAR  A menudo, la sensibilidad en las mamas puede tratarse en Advice worker. Puede intentar lo siguiente:  Probarse un nuevo sostn que le brinde ms sujecin, especialmente mientras hace actividad fsica.  Usar un sostn con mejor sujecin o uno deportivo mientras duerme cuando las mamas estn muy sensibles.  Si tiene una lesin mamaria, aplique hielo en la zona:  Ponga el hielo en una bolsa plstica.  Colquese una toalla entre la piel y la bolsa de hielo.  Deje el hielo durante 20 minutos y aplquelo 2 a 3 veces por da.  Si  tiene las Energy [employer] de [place] debido a la Market researcher, intente lo siguiente:  Extrigase [employer] o con un sacaleche.  Aplquese una compresa tibia en las mamas para ayudar a Manufacturing systems engineer.  Tome analgsicos de venta libre si su mdico lo autoriza.  Tome otros medicamentos que su mdico le recete, entre ellos, antibiticos o anticonceptivos. A largo plazo, puede aliviar la sensibilidad en las mamas si hace lo siguiente:  Disminuye el consumo de cafena.  Disminuye la cantidad de grasa de la dieta. Lleva un registro de [address] y las horas cuando tiene mayor sensibilidad en las [place]. Esto ser de ayuda para que usted y su mdico encuentren la causa de la sensibilidad y cmo Runner, broadcasting/film/video. Adems, aprenda cmo examinarse las mamas en casa. Esto la ayudar a palpar un crecimiento o un bulto fuera de lo normal que podra causar la sensibilidad. SOLICITE ATENCIN MDICA SI:   Cualquier zona de la mama est dura, enrojecida y caliente al tacto. Puede ser un signo de infeccin.  Hay secrecin de los pezones (y no est amamantando). En especial, vigile la secrecin de sangre o pus.  Tiene fiebre, adems de sensibilidad en las mamas.  Tiene un bulto nuevo o doloroso en la mama que no desaparece despus de la finalizacin del perodo menstrual.  Ha intentando controlar el dolor en casa, pero no desaparece.  El dolor de la mama es ms intenso o le dificulta hacer las cosas que hace habitualmente durante el da.   Esta informacin no tiene Theme park manager el consejo del mdico. Asegrese de  hacerle al mdico cualquier pregunta que tenga.   Document Released: 04/01/2013 Elsevier Interactive Patient Education [employer]

## 2016-01-14 NOTE — ED Notes (Addendum)
Patient returned from Ultrasound. 

## 2016-01-14 NOTE — ED Provider Notes (Signed)
CSN: [id number]     Arrival date & time 01/14/16  4128 History   First MD Initiated Contact with Patient 01/14/16 [phone]     No chief complaint on file.    (Consider location/radiation/quality/duration/timing/severity/associated sxs/prior Treatment) The history is provided by the patient.     Pt presents with 2 weeks of left breast pain.  Pain is constant, exacerbated by breastfeeding.  When her breast pain increases she also has pain in her left upper back.   She is breastfeeding and infant feeds well from each side, last feeding was around 7am on the left side.  Denies fevers, redness of her skin, deeper chest pain, SOB, cough.  Has not noticed any blood coming from her nipple.   Was seen by a [facility] dr and was scheduled for an ultrasound but this was canceled due to scheduling problem, taking tylenol without improvement (500mg  Q 6 hrs).    Past Medical History  Diagnosis Date  . Gestational diabetes    Past Surgical History  Procedure Laterality Date  . No past surgeries     Family History  Problem Relation Age of Onset  . Diabetes Mother   . Diabetes Father   . Kidney disease Brother   . Alcohol abuse Neg Hx   . Arthritis Neg Hx   . Asthma Neg Hx   . Birth defects Neg Hx   . Cancer Neg Hx   . COPD Neg Hx   . Depression Neg Hx   . Drug abuse Neg Hx   . Early death Neg Hx   . Hearing loss Neg Hx   . Heart disease Neg Hx   . Hyperlipidemia Neg Hx   . Hypertension Neg Hx   . Learning disabilities Neg Hx   . Mental illness Neg Hx   . Mental retardation Neg Hx   . Miscarriages / Stillbirths Neg Hx   . Stroke Neg Hx   . Vision loss Neg Hx   . Varicose Veins Neg Hx    Social History  Substance Use Topics  . Smoking status: Never Smoker   . Smokeless tobacco: Never Used  . Alcohol Use: No   OB History    Gravida Para Term Preterm AB TAB SAB Ectopic Multiple Living   3 3 3       0 3     Review of Systems  Constitutional: Negative for fever and chills.  Respiratory:  Negative for cough, shortness of breath, wheezing and stridor.   Musculoskeletal: Positive for back pain. Negative for myalgias and joint swelling.  Skin: Negative for color change and rash.  Allergic/Immunologic: Negative for immunocompromised state.  Hematological: Does not bruise/bleed easily.  Psychiatric/Behavioral: Negative for self-injury.      Allergies  Review of patient's allergies indicates no known allergies.  Home Medications   Prior to Admission medications   Not on File   BP 139/69 mmHg  Pulse 64  Temp(Src) 97.7 F (36.5 C) (Oral)  Resp 18  SpO2 97% Physical Exam  Constitutional: She appears well-developed and well-nourished. No distress.  HENT:  Head: Normocephalic and atraumatic.  Neck: Neck supple.  Cardiovascular: Normal rate and regular rhythm.   Pulmonary/Chest: Effort normal and breath sounds normal. No respiratory distress. She has no wheezes. She has no rales. She exhibits tenderness. She exhibits no mass, no laceration, no crepitus and no retraction. Left breast exhibits tenderness. Left breast exhibits no inverted nipple, no mass and no skin change. Breasts are symmetrical.    L Breast  exam performed with chaperon present   Abdominal: Soft. She exhibits no distension. There is no tenderness. There is no rebound and no guarding.  Neurological: She is alert.  Skin: She is not diaphoretic.  Nursing note and vitals reviewed.   ED Course  Procedures (including critical care time) Labs Review Labs Reviewed - No data to display  Imaging Review Dg Chest 2 View  01/14/2016  CLINICAL DATA:  Left breast pain and tenderness. EXAM: CHEST  2 VIEW COMPARISON:  06/08/2010 FINDINGS: The heart size and mediastinal contours are within normal limits. There is no evidence of pulmonary edema, consolidation, pneumothorax, nodule or pleural fluid. The visualized skeletal structures are unremarkable. IMPRESSION: No active cardiopulmonary disease. Electronically Signed    By: Irish Lack M.D.   On: 01/14/2016 10:55   I have personally reviewed and evaluated these images and lab results as part of my medical decision-making.   EKG Interpretation None      MDM   Final diagnoses:  Pain  Breast pain, left    Afebrile, nontoxic patient with left breast pain x 2 weeks, worse with breastfeeding.  No clinical e/o infection.  [place] negative for abscess.   CXR negative. D/C home with recommendations for warm compresses, massage, tylenol, motrin, OB follow up.   Discussed result, findings, treatment, and follow up  with patient.  Pt given return precautions.  Pt verbalizes understanding and agrees with plan.         Trixie Dredge, PA-C 01/14/16 1513   Courteney Randall An, MD 01/15/16 [phone]

## 2016-01-14 NOTE — ED Notes (Addendum)
patien here with left breast discomfort and tenderness x 2 weeks. States that she is currently breast feeding, tenderness to palpation. No redness or fever. Seen at [facility] and waiting for appointment for the U/S of breast

## 2016-01-30 ENCOUNTER — Inpatient Hospital Stay (HOSPITAL_COMMUNITY)
Admission: AD | Admit: 2016-01-30 | Discharge: 2016-01-30 | Disposition: A | Discharge status: Home / Self Care | Payer: Self-pay | Source: Ambulatory Visit | Attending: Obstetrics & Gynecology | Admitting: Obstetrics & Gynecology

## 2016-01-30 ENCOUNTER — Encounter (HOSPITAL_COMMUNITY): Payer: Self-pay

## 2016-01-30 DIAGNOSIS — Z8249 Family history of ischemic heart disease and other diseases of the circulatory system: Secondary | ICD-10-CM | POA: Insufficient documentation

## 2016-01-30 DIAGNOSIS — Z8632 Personal history of gestational diabetes: Secondary | ICD-10-CM | POA: Insufficient documentation

## 2016-01-30 DIAGNOSIS — Z841 Family history of disorders of kidney and ureter: Secondary | ICD-10-CM | POA: Insufficient documentation

## 2016-01-30 DIAGNOSIS — Z833 Family history of diabetes mellitus: Secondary | ICD-10-CM | POA: Insufficient documentation

## 2016-01-30 DIAGNOSIS — N644 Mastodynia: Secondary | ICD-10-CM | POA: Insufficient documentation

## 2016-01-30 DIAGNOSIS — Z3202 Encounter for pregnancy test, result negative: Secondary | ICD-10-CM | POA: Insufficient documentation

## 2016-01-30 DIAGNOSIS — Z823 Family history of stroke: Secondary | ICD-10-CM | POA: Insufficient documentation

## 2016-01-30 LAB — URINALYSIS, ROUTINE W REFLEX MICROSCOPIC
BILIRUBIN URINE: NEGATIVE
Glucose, UA: NEGATIVE mg/dL
Hgb urine dipstick: NEGATIVE
Ketones, ur: NEGATIVE mg/dL
Leukocytes, UA: NEGATIVE
NITRITE: NEGATIVE
PH: 7 (ref 5.0–8.0)
Protein, ur: NEGATIVE mg/dL
SPECIFIC GRAVITY, URINE: 1.015 (ref 1.005–1.030)

## 2016-01-30 LAB — POCT PREGNANCY, URINE: PREG TEST UR: NEGATIVE

## 2016-01-30 MED ORDER — TRAMADOL HCL 50 MG PO TABS: 50.0000 mg | ORAL_TABLET | Freq: Four times a day (QID) | ORAL | 0 refills | Status: AC | PRN

## 2016-01-30 MED ORDER — IBUPROFEN 600 MG PO TABS: 600.0000 mg | ORAL_TABLET | Freq: Four times a day (QID) | ORAL | 0 refills | Status: AC | PRN

## 2016-01-30 NOTE — Discharge Instructions (Signed)
Sensibilidad en las mamas (Breast Tenderness) La sensibilidad en las mamas es un problema frecuente en las mujeres de todas las edades. y puede causar molestias leves o dolor intenso. Sus causas son variadas. Su mdico determinar la causa probable de la sensibilidad Education administratormediante el examen de las [place], las preguntas United Stationerssobre los sntomas y la indicacin de algunos estudios. Por lo general, la sensibilidad en las mamas no significa que tenga cncer de mama. INSTRUCCIONES PARA EL CUIDADO EN EL HOGAR  A menudo, la sensibilidad en las mamas puede tratarse en Advice workerel hogar. Puede intentar lo siguiente:  Probarse un nuevo sostn que le brinde ms sujecin, especialmente mientras hace actividad fsica.  Usar un sostn con mejor sujecin o uno deportivo mientras duerme cuando las mamas estn muy sensibles.  Si tiene una lesin mamaria, aplique hielo en la zona:  Ponga el hielo en una bolsa plstica.  Colquese una toalla entre la piel y la bolsa de hielo.  Deje el hielo durante 20 minutos y aplquelo 2 a 3 veces por da.  Si tiene las Energy East Corporationmamas repletas de [place] debido a la Market researcherlactancia, intente lo siguiente:  Extrigase United Stationersleche manualmente o con un sacaleche.  Aplquese una compresa tibia en las mamas para ayudar a Manufacturing systems engineerla descarga.  Tome analgsicos de venta libre si su mdico lo autoriza.  Tome otros medicamentos que su mdico le recete, entre ellos, antibiticos o anticonceptivos. A largo plazo, puede aliviar la sensibilidad en las mamas si hace lo siguiente:  Disminuye el consumo de cafena.  Disminuye la cantidad de grasa de la dieta. Lleva un registro de [address] y las horas cuando tiene mayor sensibilidad en las [place]amas. Esto ser de ayuda para que usted y su mdico encuentren la causa de la sensibilidad y cmo Runner, broadcasting/film/videoaliviarla. Adems, aprenda cmo examinarse las mamas en casa. Esto la ayudar a palpar un crecimiento o un bulto fuera de lo normal que podra causar la sensibilidad. SOLICITE ATENCIN MDICA SI:    Cualquier zona de la mama est dura, enrojecida y caliente al tacto. Puede ser un signo de infeccin.  Hay secrecin de los pezones (y no est amamantando). En especial, vigile la secrecin de sangre o pus.  Tiene fiebre, adems de sensibilidad en las mamas.  Tiene un bulto nuevo o doloroso en la mama que no desaparece despus de la finalizacin del perodo menstrual.  Ha intentando controlar el dolor en casa, pero no desaparece.  El dolor de la mama es ms intenso o le dificulta hacer las cosas que hace habitualmente durante el da.   Esta informacin no tiene Theme park managercomo fin reemplazar el consejo del mdico. Asegrese de hacerle al mdico cualquier pregunta que tenga.   Document Released: 04/01/2013 Elsevier Interactive Patient Education 2016 Arvin[employer]vier Inc.   Big WellsMamografa (Mammogram) Thereasa SoloUna mamografa es un radiografa de las mamas que se realiza para determinar si hay cambios que no son normales. Este estudio permite explorar y Clinical research associateencontrar cualquier cambio que pudiera sugerir la presencia de cncer de mama. Tambin puede ayudar a identificar otros cambios y variaciones en las mamas. ANTES DEL PROCEDIMIENTO  Hgase este estudio aproximadamente 1 o 2 semanas despus de la menstruacin. Generalmente, este es el momento en que las mamas estn menos sensibles.  Si consulta a un mdico nuevo o cambia de Financial risk analystclnica, enve las Ball Corporationmamografas anteriores al Corning Incorporatednuevo consultorio.  El da del estudio, [address] las [place]amas y [place]las axilas.  No use desodorantes, perfumes, lociones o talcos el da del estudio.  Qutese las alhajas del cuello.  Use prendas  que pueda ponerse y sacarse fcilmente. PROCEDIMIENTO  Se quitar la ropa de la cintura para [place]. Se colocar una bata.  Debe permanecer de pie delante de la mquina de rayos-X.  Se colocar cada mama entre dos placas de vidrio o de plstico. Las placas comprimirn las mamas durante unos segundos. Intente estar lo ms relajada posible. Esto no causa ningn  dao a las mamas. Si siente alguna molestia, ser pasajera.  Se tomarn radiografas desde diferentes ngulos de cada mama. Este procedimiento puede variar segn el mdico y el hospital. DESPUS DEL PROCEDIMIENTO  La mamografa ser examinada por un especialista (radilogo).  Tal vez deba repetir Apple Computer del estudio. Esto depende de la calidad de las imgenes.  Pregunte la fecha en que los resultados estarn disponibles. Asegrese de Starbucks Corporation.  Puede retomar sus actividades habituales.   Esta informacin no tiene Theme park manager el consejo del mdico. Asegrese de hacerle al mdico cualquier pregunta que tenga.   Document Released: 07/14/2010 Document Revised: 05/31/2011 Elsevier Interactive Patient Education Yahoo! Inc.

## 2016-01-30 NOTE — MAU Note (Signed)
Left breast hurts. Pt is breast feeding, baby is 5 mon.  Has been hurting since the baby was born, pain is getting worse. Nipple hurts and it burns inside.  No bleeding from nipple.  No redness or bruising noted. [place] [address], the doctor thought she felt something inside.  Had an US done and nothing was seen.  Past 4 days, baby is rejecting the left breast.  Takes Ibuprofen, some relief, but has to take continually.

## 2016-01-30 NOTE — MAU Provider Note (Signed)
History     CSN: [id number]  Arrival date and time: 01/30/16 1602   First Provider Initiated Contact with Patient 01/30/16 1754      Chief Complaint  Patient presents with  . Breast Pain   HPI   Ms.Paula Holland is a 30 y.o. female [phone] here with Left breast pain. The pain started when her baby was born 5 months ago. The pain occurs all the time, when she takes ibuprofen the pain is better, however shortly after it returns.  She was seen by an ED Dr.last month who did an US that did not show a breast abscess. However the pain has continued.  The pain worsens when she breast feeds her baby and it is very painful. The patient is worried because she is taking ibuprofen daily.   OB History    Gravida Para Term Preterm AB Living   3 3 3     3    SAB TAB Ectopic Multiple Live Births         0 3      Past Medical History:  Diagnosis Date  . Gestational diabetes     Past Surgical History:  Procedure Laterality Date  . NO PAST SURGERIES      Family History  Problem Relation Age of Onset  . Diabetes Mother   . Diabetes Father   . Kidney disease Brother   . Alcohol abuse Neg Hx   . Arthritis Neg Hx   . Asthma Neg Hx   . Birth defects Neg Hx   . Cancer Neg Hx   . COPD Neg Hx   . Depression Neg Hx   . Drug abuse Neg Hx   . Early death Neg Hx   . Hearing loss Neg Hx   . Heart disease Neg Hx   . Hyperlipidemia Neg Hx   . Hypertension Neg Hx   . Learning disabilities Neg Hx   . Mental illness Neg Hx   . Mental retardation Neg Hx   . Miscarriages / Stillbirths Neg Hx   . Stroke Neg Hx   . Vision loss Neg Hx   . Varicose Veins Neg Hx     Social History  Substance Use Topics  . Smoking status: Never Smoker  . Smokeless tobacco: Never Used  . Alcohol use No    Allergies: No Known Allergies  Prescriptions Prior to Admission  Medication Sig Dispense Refill Last Dose  . ibuprofen (ADVIL,MOTRIN) 600 MG tablet Take 1 tablet (600 mg total) by mouth every 6  (six) hours as needed for mild pain or moderate pain (1 tableta cada 6 horas si lo necesite para dolor). 20 tablet 0 01/30/2016 at Unknown time  . Prenatal Vit-Fe Fumarate-FA (PRENATAL MULTIVITAMIN) TABS tablet Take 1 tablet by mouth daily at 12 noon.   01/30/2016 at Unknown time   Results for orders placed or performed during the hospital encounter of 01/30/16 (from the past 48 hour(s))  Urinalysis, Routine w reflex microscopic (not at [facility])     Status: Abnormal   Collection Time: 01/30/16  4:27 PM  Result Value Ref Range   Color, Urine YELLOW YELLOW   APPearance HAZY (A) CLEAR   Specific Gravity, Urine 1.015 1.005 - 1.030   pH 7.0 5.0 - 8.0   Glucose, UA NEGATIVE NEGATIVE mg/dL   Hgb urine dipstick NEGATIVE NEGATIVE   Bilirubin Urine NEGATIVE NEGATIVE   Ketones, ur NEGATIVE NEGATIVE mg/dL   Protein, ur NEGATIVE NEGATIVE mg/dL   Nitrite NEGATIVE NEGATIVE  Leukocytes, UA NEGATIVE NEGATIVE    Comment: MICROSCOPIC NOT DONE ON URINES WITH NEGATIVE PROTEIN, BLOOD, LEUKOCYTES, NITRITE, OR GLUCOSE <1000 mg/dL.  Pregnancy, urine POC     Status: None   Collection Time: 01/30/16  4:32 PM  Result Value Ref Range   Preg Test, Ur NEGATIVE NEGATIVE    Comment:        THE SENSITIVITY OF THIS METHODOLOGY IS >24 mIU/mL     Review of Systems  Constitutional: Negative for chills and fever.   Physical Exam   Blood pressure 126/66, pulse (!) 56, temperature 98 F (36.7 C), temperature source Oral, resp. rate 16, currently breastfeeding.  Physical Exam  Constitutional: She is oriented to person, place, and time. She appears well-developed and well-nourished. No distress.  HENT:  Head: Normocephalic.  Respiratory: Effort normal. Left breast exhibits mass and tenderness. Left breast exhibits no inverted nipple, no nipple discharge and no skin change.    At 8 o'clock of left breast there is a mobile, pea size, tender nodule noted. There is no surround erythema, there is no skin changes.     Musculoskeletal: Normal range of motion.  Neurological: She is alert and oriented to person, place, and time.  Skin: Skin is warm. She is not diaphoretic.  Psychiatric: Her behavior is normal.    MAU Course  Procedures  None  MDM  UA UPT  Assessment and Plan   A:  1. Breast pain, left     P:  Discharge home in stable condition Message sent to Stoney Bang with the B[facility]CP program for mammogram. Contact information for Stoney Bang given to the patient.  Rx: Ibuprofen, tramadol  Limit ibuprofen use if able, take with food.  Return to MAU with any fever.  Ok to continue breast feeding.     Duane Lope, NP 01/30/2016 7:53 PM

## 2016-02-21 ENCOUNTER — Other Ambulatory Visit (HOSPITAL_COMMUNITY): Payer: Self-pay | Admitting: *Deleted

## 2016-02-21 DIAGNOSIS — N6320 Unspecified lump in the left breast, unspecified quadrant: Secondary | ICD-10-CM

## 2016-02-21 DIAGNOSIS — N644 Mastodynia: Secondary | ICD-10-CM

## 2016-03-01 ENCOUNTER — Ambulatory Visit (HOSPITAL_COMMUNITY)
Admission: RE | Admit: 2016-03-01 | Discharge: 2016-03-01 | Disposition: A | Discharge status: Home / Self Care | Payer: Self-pay | Source: Ambulatory Visit | Attending: Obstetrics and Gynecology | Admitting: Obstetrics and Gynecology

## 2016-03-01 ENCOUNTER — Ambulatory Visit
Admission: RE | Admit: 2016-03-01 | Discharge: 2016-03-01 | Disposition: A | Discharge status: Home / Self Care | Payer: No Typology Code available for payment source | Source: Ambulatory Visit | Attending: Obstetrics and Gynecology | Admitting: Obstetrics and Gynecology

## 2016-03-01 ENCOUNTER — Encounter (HOSPITAL_COMMUNITY): Payer: Self-pay

## 2016-03-01 ENCOUNTER — Other Ambulatory Visit: Payer: Self-pay

## 2016-03-01 VITALS — BP 110/70 | Temp 97.8°F | Ht 63.0 in | Wt 179.0 lb

## 2016-03-01 DIAGNOSIS — N644 Mastodynia: Secondary | ICD-10-CM

## 2016-03-01 DIAGNOSIS — N6320 Unspecified lump in the left breast, unspecified quadrant: Secondary | ICD-10-CM

## 2016-03-01 DIAGNOSIS — Z1239 Encounter for other screening for malignant neoplasm of breast: Secondary | ICD-10-CM

## 2016-03-01 DIAGNOSIS — N6324 Unspecified lump in the left breast, lower inner quadrant: Secondary | ICD-10-CM

## 2016-03-01 NOTE — Patient Instructions (Signed)
Explained breast self awareness to Paula GreathouseMaria I Holland. Patient did not need a Pap smear today due to last Pap smear was in February 2016 per patient. Let her know [facility] will cover Pap smears every 3 years unless has a history of abnormal Pap smears. [facility]. Appointment scheduled for Thursday, March 01, 2016 at 1110.Paula GreathouseMaria I Holland verbalized understanding.  Derward Marple, Kathaleen Maserhristine Poll, RN 11:46 AM

## 2016-03-01 NOTE — Progress Notes (Signed)
Complaints of left breast lump x 3 months and left breast pain x 6 months. Patient stated the pain is constant. Patient rates the pain at a 10 out of 10.  Pap Smear:  Pap smear not completed today. [facility] and normal per patient. Per patient has no history of an abnormal Pap smear. No Pap smear results in EPIC.  Physical exam: Breasts Breasts symmetrical. No skin abnormalities bilateral breasts. No nipple retraction bilateral breasts. Patient is currently breastfeeding her 6 month baby. No lymphadenopathy. No lumps palpated right breast. Palpated a lump within the left breast at 8 o'clock 5 cm from the nipple. Complaints of left inner breast tenderness on exam that was greater when palpated lump. [facility]. Appointment scheduled for Thursday, March 01, 2016 at 1110.        Pelvic/Bimanual No Pap smear completed today since last Pap smear was in February 2016 per patient. Pap smear not indicated per BCCCP guidelines.   Smoking History: Patient has never smoked.  Patient Navigation: Patient education provided. [facility]. Spanish interpreter provided.   Used Spanish interpreter Halliburton CompanyBlanca Lindner.

## 2016-03-02 ENCOUNTER — Encounter (HOSPITAL_COMMUNITY): Payer: Self-pay | Admitting: *Deleted

## 2016-03-20 ENCOUNTER — Encounter (HOSPITAL_COMMUNITY): Payer: Self-pay | Admitting: *Deleted

## 2016-03-23 LAB — PROCEDURE REPORT - SCANNED: Pap: NEGATIVE

## 2016-05-04 ENCOUNTER — Ambulatory Visit (HOSPITAL_COMMUNITY): Payer: No Typology Code available for payment source

## 2017-12-05 IMAGING — US US MFM FETAL BPP W/O NON-STRESS
1 series · 12 of 19 positions shown · non-contrast
Comparison: none

[Series 1: us mfm fetal bpp w/o non-stress · 19 acquisitions, 12 frames shown]
[im 1/19]
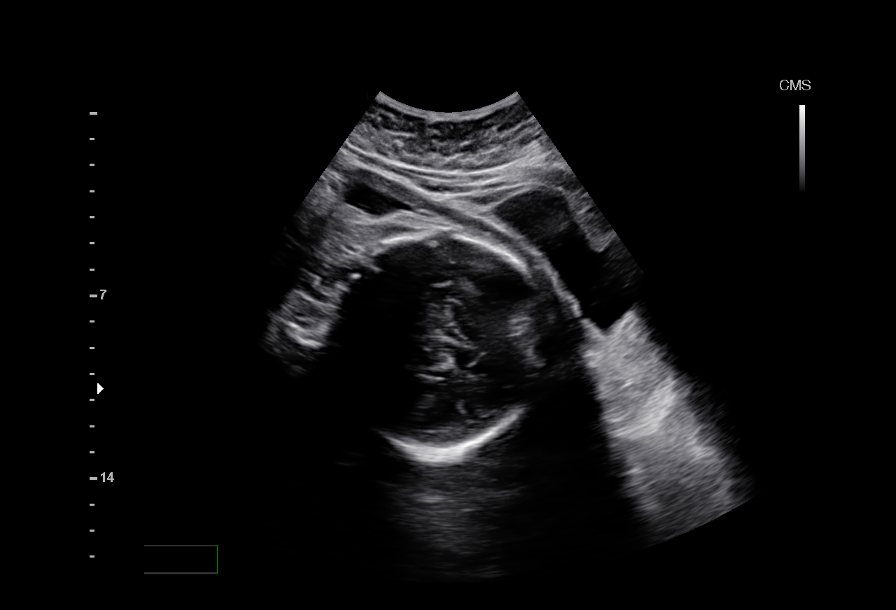
[im 3/19]
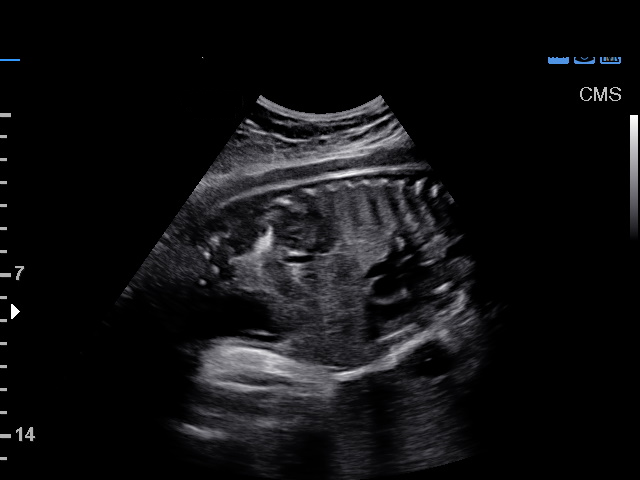
[im 4/19]
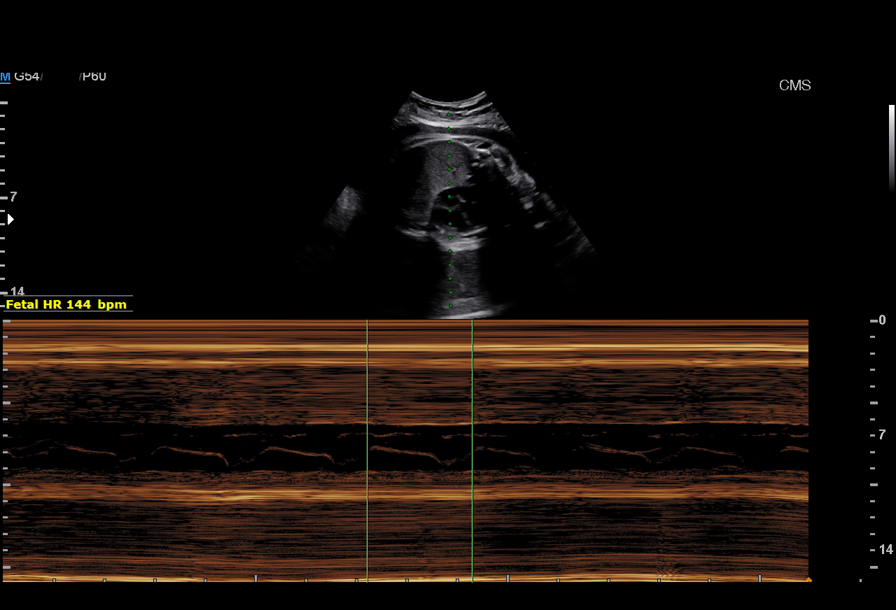
[im 6/19]
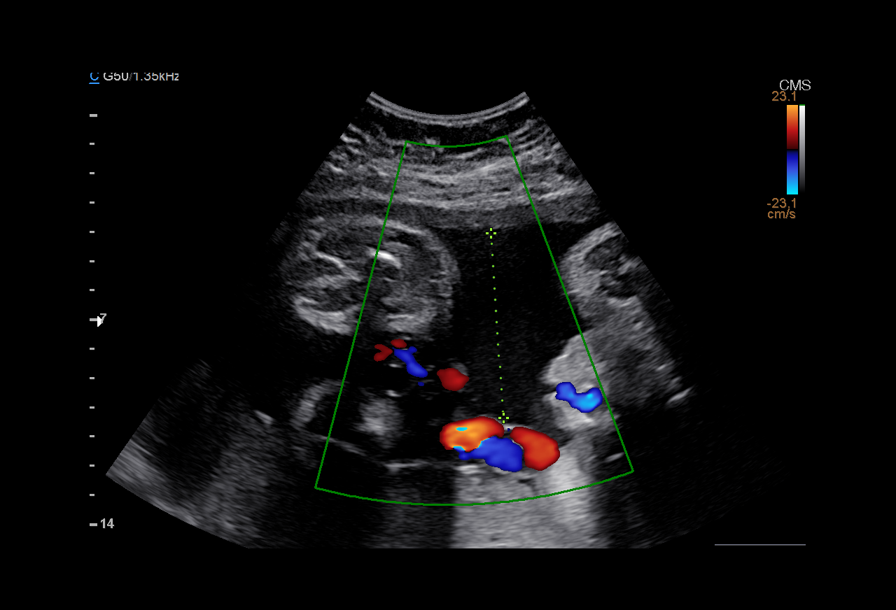
[im 8/19]
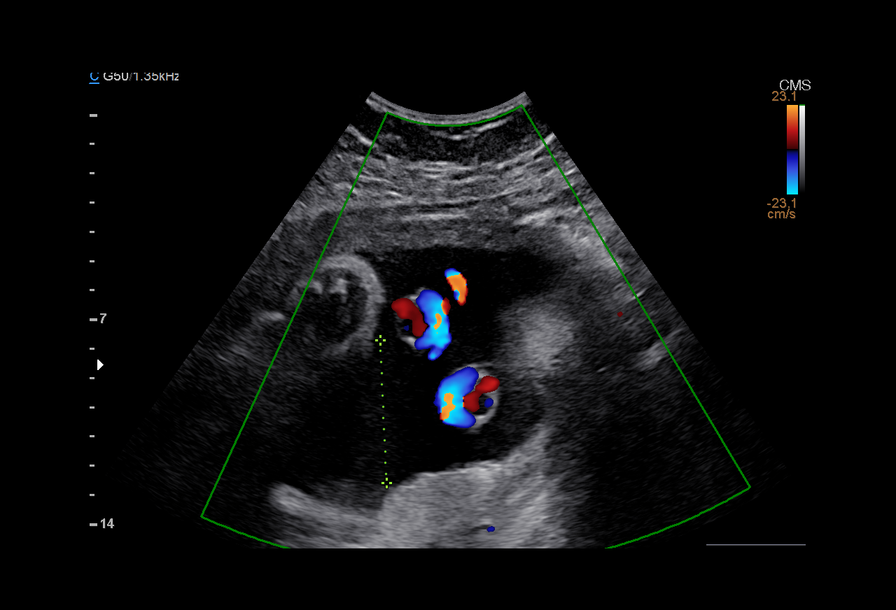
[im 9/19]
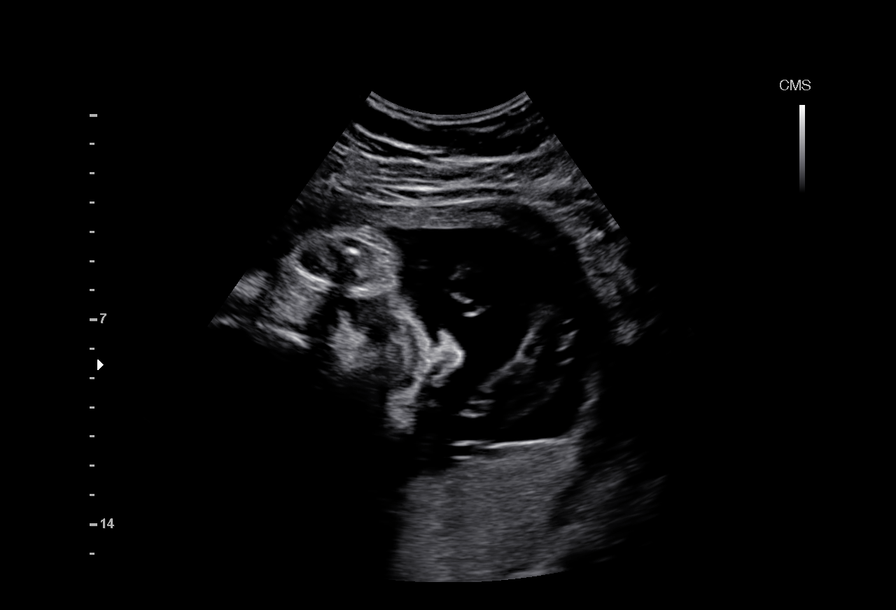
[im 11/19]
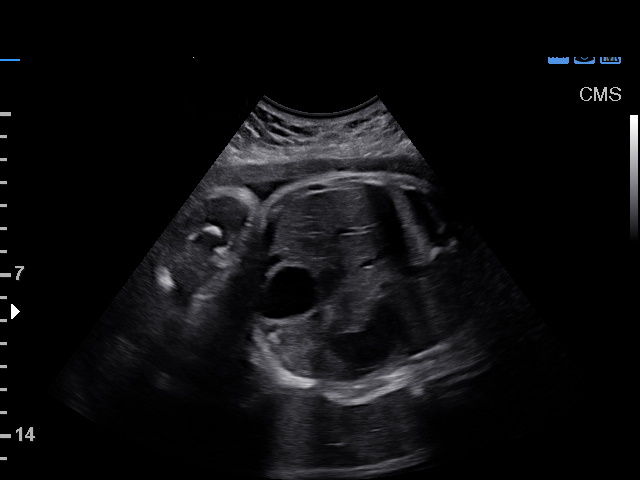
[im 12/19]
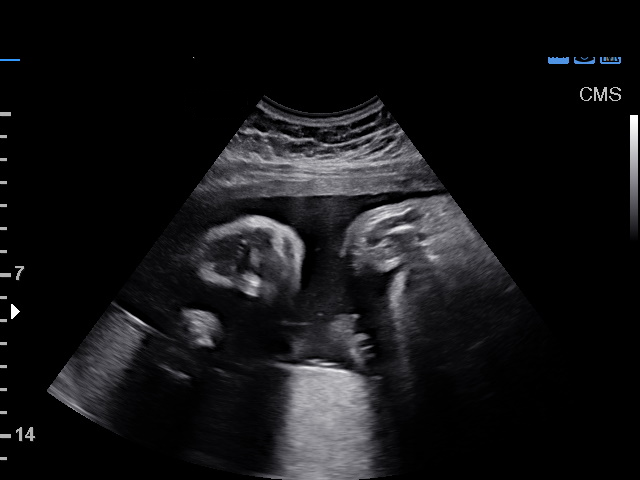
[im 14/19]
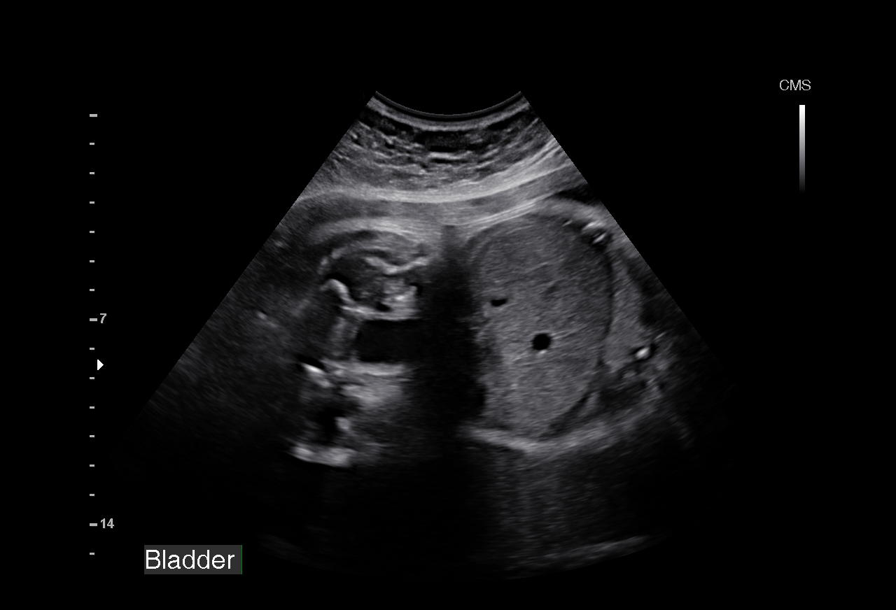
[im 16/19]
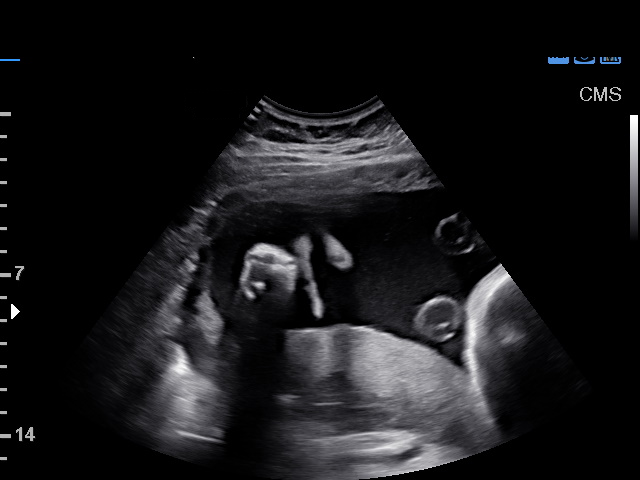
[im 17/19]
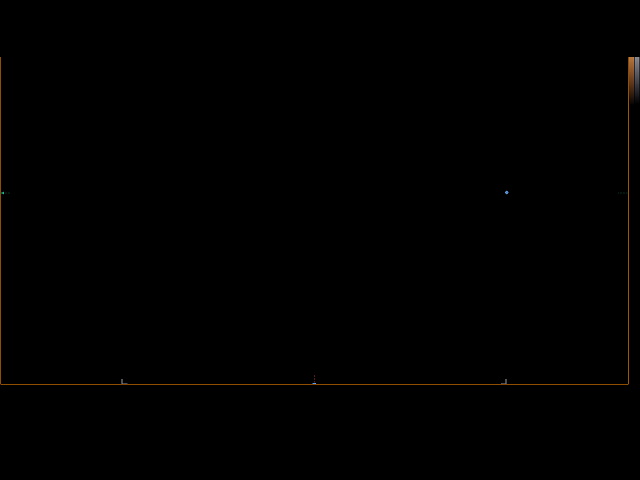
[im 19/19]
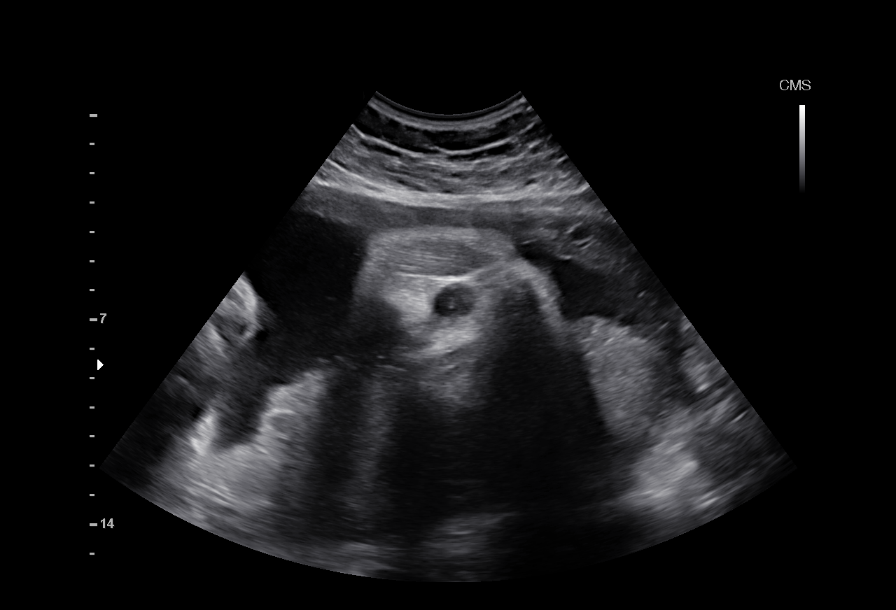

[12 of 19 positions shown; findings below may reference images not displayed]

[facility]

Indications

34 weeks gestation of pregnancy
Gestational diabetes in pregnancy,
controlled by oral hypoglycemic drugs
OB History

Gravidity:    3         Term:   2        Prem:   0         SAB:   0
TOP:          0       Ectopic:  0        Living: 2
Fetal Evaluation

Num Of Fetuses:     1
Fetal Heart         144
Rate(bpm):
Cardiac Activity:   Observed
Presentation:       Cephalic

Amniotic Fluid
AFI FV:      Subjectively within normal limits
AFI Sum:     18.83    cm      70  %Tile      Larg Pckt:   6.33  cm
RUQ:   2.13    cm   RLQ:    4.88   cm    LUQ:   6.33    cm    LLQ:   5.49    cm
Biophysical Evaluation

Amniotic F.V:   Within normal limits       F. Tone:         Observed
F. Movement:    Observed                   Score:           [DATE]
F. Breathing:   Observed
Gestational Age

Best:          34w 0d     Det. By:  Early Ultrasound         EDD:    09/07/15
Impression

SIUP at 34+0 weeks
Normal amniotic fluid volume
BPP [DATE]
Recommendations

Continue antenatal testing

## 2017-12-27 IMAGING — US US MFM FETAL BPP W/ NON-STRESS
1 series · 14 of 28 positions shown · non-contrast
Comparison: none

[Series 1: us mfm fetal bpp w/ non-stress · 51 acquisitions, 14 frames shown]
[im 2/51]
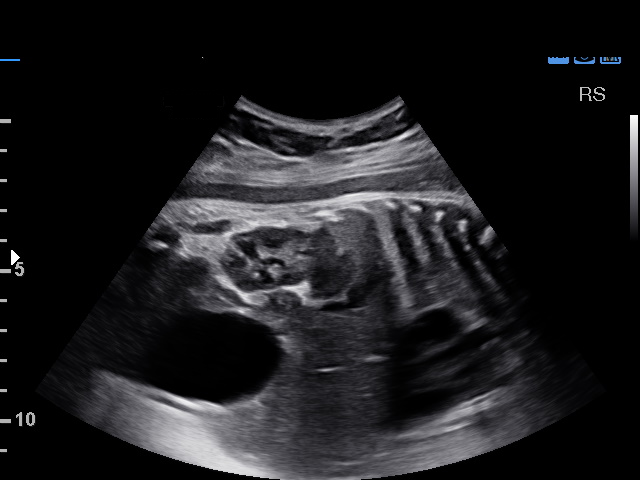
[im 6/51]
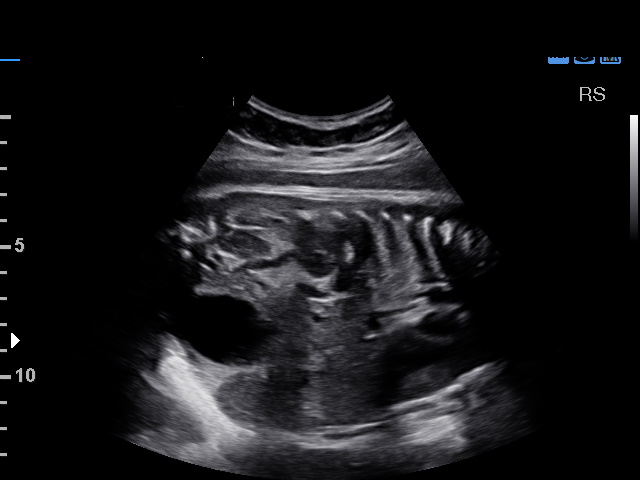
[im 10/51]
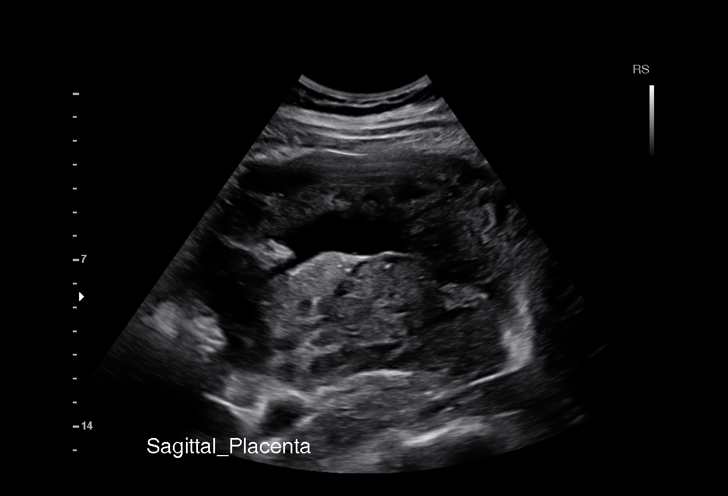
[im 13/51]
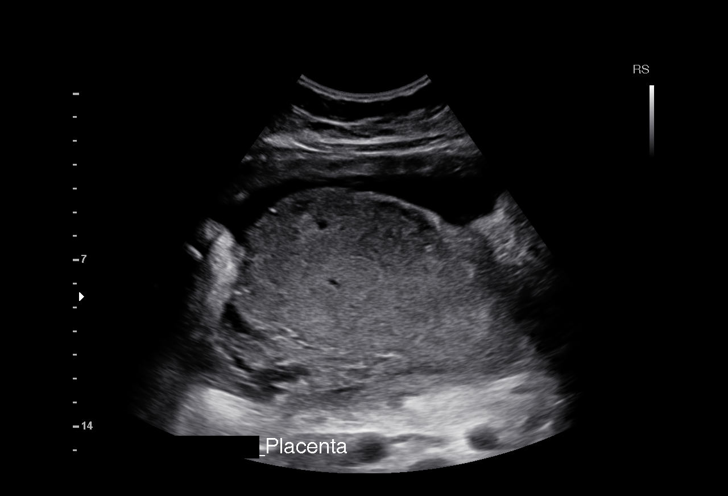
[im 17/51]
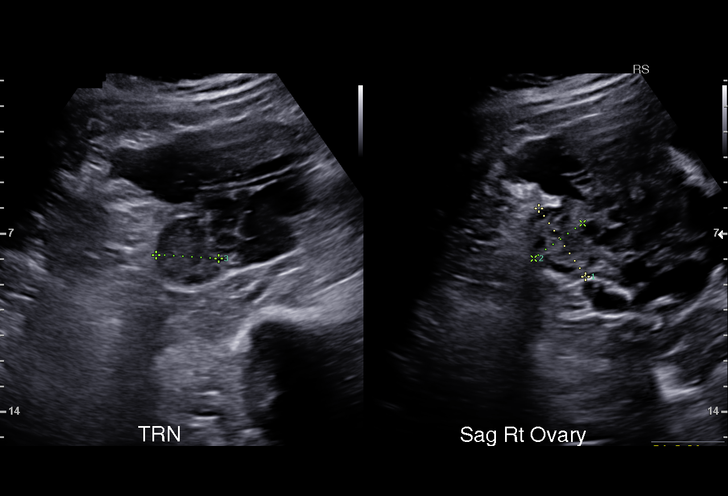
[im 21/51]
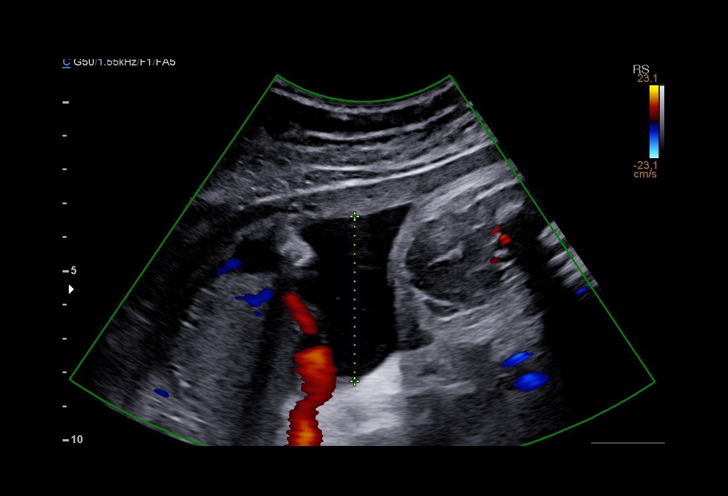
[im 25/51]
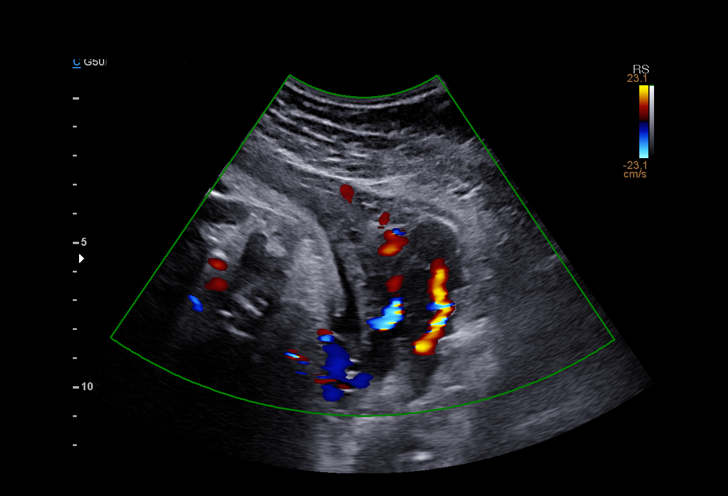
[im 28/51]
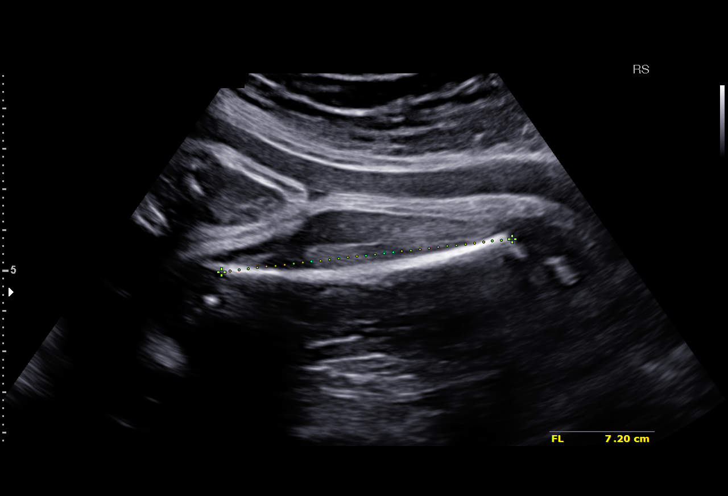
[im 32/51]
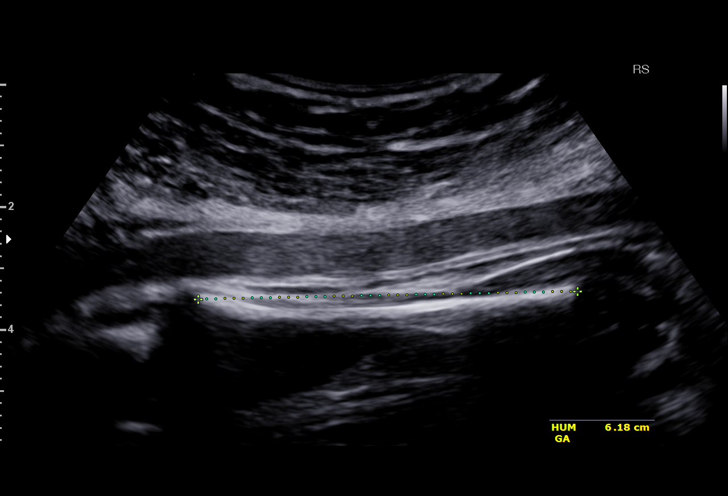
[im 36/51]
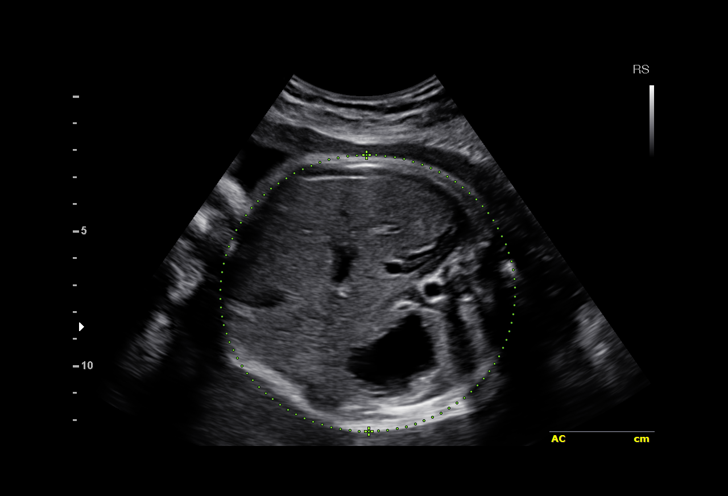
[im 39/51]
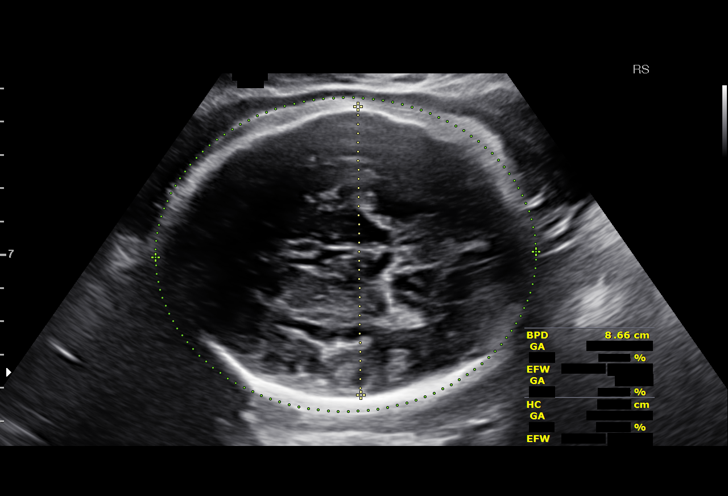
[im 43/51]
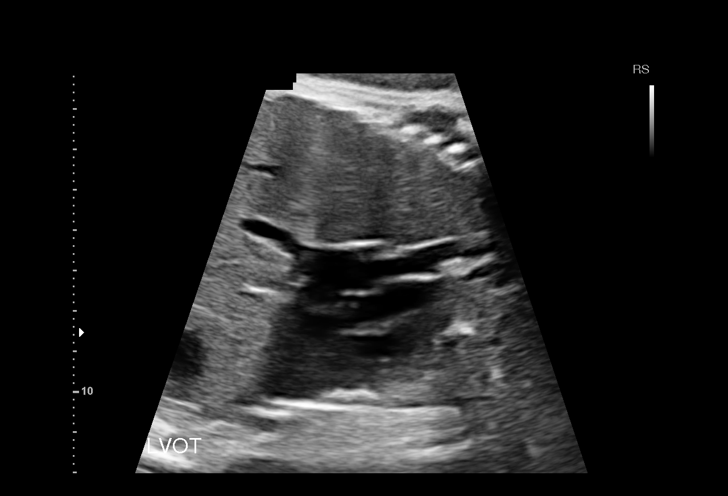
[im 47/51]
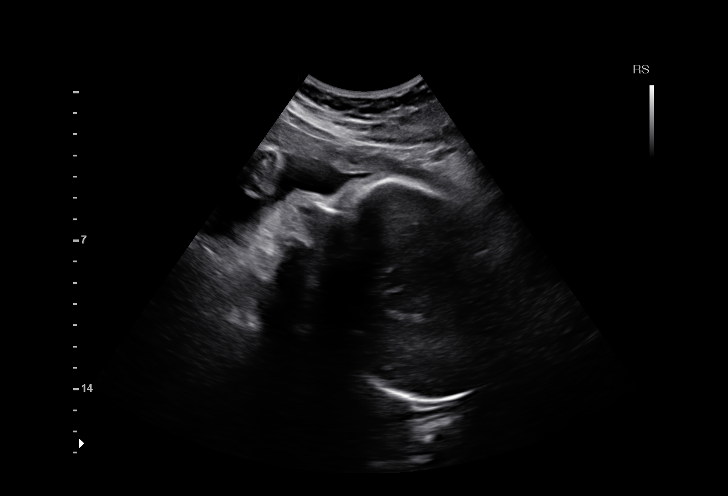
[im 51/51]
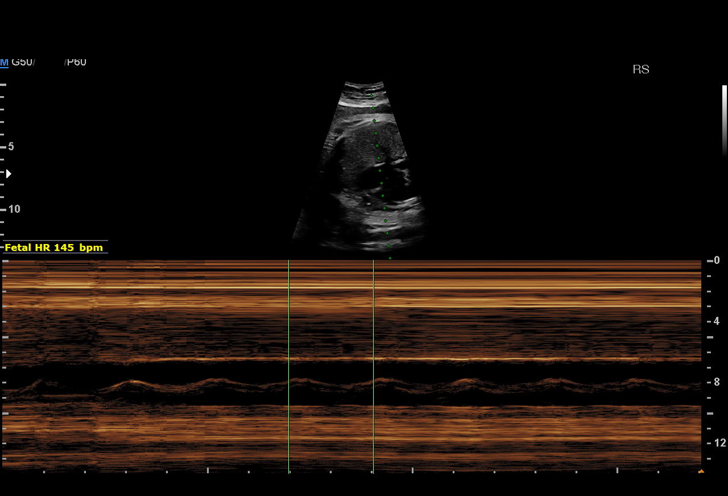

[14 of 28 positions shown; findings below may reference images not displayed]

[facility]

Indications

Gestational diabetes in pregnancy,
controlled by oral hypoglycemic drugs
37 weeks gestation of pregnancy
OB History

Gravidity:    3         Term:   2        Prem:   0        SAB:   0
TOP:          0       Ectopic:  0        Living: 2
Fetal Evaluation

Num Of Fetuses:     1
Fetal Heart         145
Rate(bpm):
Cardiac Activity:   Observed
Presentation:       Cephalic
Placenta:           Posterior, above cervical os

Amniotic Fluid
AFI FV:      Subjectively within normal limits
AFI Sum:     16.47   cm      62   %Tile     Larg Pckt:   6.56   cm
RUQ:   4.87   cm    RLQ:    2.66   cm    LUQ:   6.56    cm   LLQ:    2.38   cm
Biophysical Evaluation

Amniotic F.V:   Pocket => 2 cm two         F. Tone:        Not Observed
planes
F. Movement:    Observed                   Score:          [DATE]
F. Breathing:   Observed
Biometry

BPD:      86.9  mm     G. Age:  35w 0d                  CI:        70.67   %   70 - 86
FL/HC:      21.6   %   20.8 -
HC:      329.5  mm     G. Age:  37w 4d         33  %    HC/AC:      0.98       0.92 -
AC:      336.7  mm     G. Age:  37w 4d         76  %    FL/BPD:     81.8   %   71 - 87
FL:       71.1  mm     G. Age:  36w 3d         32  %    FL/AC:      21.1   %   20 - 24
HUM:      61.1  mm     G. Age:  35w 3d         38  %
LV:        8.5  mm

Est. FW:    3299  gm    6 lb 13 oz      67  %
Gestational Age

U/S Today:     36w 5d                                        EDD:   09/10/15
Best:          37w 1d    Det. By:   Early Ultrasound         EDD:   09/07/15
Anatomy

Cranium:          Previously seen        Aortic Arch:      Previously seen
Fetal Cavum:      Not well visualized    Ductal Arch:      Not well visualized
Ventricles:       Appears normal         Diaphragm:        Previously seen
Choroid Plexus:   Previously seen        Stomach:          Appears normal, left
sided
Cerebellum:       Previously seen        Abdomen:          Previously seen
Posterior Fossa:  Previously seen        Abdominal Wall:   Previously seen
Nuchal Fold:      Not applicable (>20    Cord Vessels:     Previously seen
wks GA)
Face:             Not well visualized    Kidneys:          Appear normal
Lips:             Not well visualized    Bladder:          Appears normal
Heart:            Appears normal         Spine:            Previously seen
(4CH, axis, and
situs)
RVOT:             Not well visualized    Upper             Previously seen
Extremities:
LVOT:             Not well visualized    Lower             Previously seen
Extremities:

Other:  Heels previously seen. Fetus appears to be a female. Technically
difficult due to advanced gestational age.
Cervix Uterus Adnexa

Cervix
Not visualized (advanced GA >46wks)

Uterus
No abnormality visualized.

Left Ovary
Within normal limits.

Right Ovary
Within normal limits.

Adnexa:       No abnormality visualized. No adnexal mass
visualized.
Impression

Single living intrauterine pregnancy at 37 weeks 1 day.
Appropriate interval fetal growth (67%).
Normal amniotic fluid volume.
Normal interval fetal anatomy.
BPP [DATE] (-2 for lack of fetal tone)
Recommendations

Continue weekly BPPs
Deliver by 39 weeks
